# Patient Record
Sex: Female | Born: 1940 | Race: White | Hispanic: No | Smoking: Never smoker
Health system: Southern US, Community
[De-identification: ages and names within clinical notes are randomized; demographics above are authoritative.]

## PROBLEM LIST (undated history)

## (undated) DIAGNOSIS — K219 Gastro-esophageal reflux disease without esophagitis: Secondary | ICD-10-CM

## (undated) DIAGNOSIS — D332 Benign neoplasm of brain, unspecified: Secondary | ICD-10-CM

## (undated) DIAGNOSIS — IMO0002 Reserved for concepts with insufficient information to code with codable children: Secondary | ICD-10-CM

## (undated) DIAGNOSIS — M5416 Radiculopathy, lumbar region: Secondary | ICD-10-CM

## (undated) DIAGNOSIS — I10 Essential (primary) hypertension: Secondary | ICD-10-CM

## (undated) DIAGNOSIS — Z86010 Personal history of colonic polyps: Principal | ICD-10-CM

## (undated) DIAGNOSIS — M8580 Other specified disorders of bone density and structure, unspecified site: Secondary | ICD-10-CM

## (undated) DIAGNOSIS — F419 Anxiety disorder, unspecified: Secondary | ICD-10-CM

## (undated) DIAGNOSIS — K5790 Diverticulosis of intestine, part unspecified, without perforation or abscess without bleeding: Secondary | ICD-10-CM

## (undated) DIAGNOSIS — M21611 Bunion of right foot: Secondary | ICD-10-CM

## (undated) DIAGNOSIS — I679 Cerebrovascular disease, unspecified: Secondary | ICD-10-CM

## (undated) DIAGNOSIS — M21612 Bunion of left foot: Secondary | ICD-10-CM

## (undated) DIAGNOSIS — E785 Hyperlipidemia, unspecified: Secondary | ICD-10-CM

## (undated) DIAGNOSIS — H8110 Benign paroxysmal vertigo, unspecified ear: Secondary | ICD-10-CM

## (undated) DIAGNOSIS — T7840XA Allergy, unspecified, initial encounter: Secondary | ICD-10-CM

## (undated) DIAGNOSIS — G5600 Carpal tunnel syndrome, unspecified upper limb: Secondary | ICD-10-CM

## (undated) DIAGNOSIS — T18128A Food in esophagus causing other injury, initial encounter: Secondary | ICD-10-CM

## (undated) DIAGNOSIS — J45909 Unspecified asthma, uncomplicated: Secondary | ICD-10-CM

## (undated) DIAGNOSIS — E039 Hypothyroidism, unspecified: Secondary | ICD-10-CM

## (undated) DIAGNOSIS — K449 Diaphragmatic hernia without obstruction or gangrene: Secondary | ICD-10-CM

## (undated) DIAGNOSIS — W57XXXA Bitten or stung by nonvenomous insect and other nonvenomous arthropods, initial encounter: Secondary | ICD-10-CM

## (undated) DIAGNOSIS — K317 Polyp of stomach and duodenum: Secondary | ICD-10-CM

## (undated) DIAGNOSIS — W44F3XA Food entering into or through a natural orifice, initial encounter: Secondary | ICD-10-CM

## (undated) DIAGNOSIS — D179 Benign lipomatous neoplasm, unspecified: Secondary | ICD-10-CM

## (undated) DIAGNOSIS — L719 Rosacea, unspecified: Secondary | ICD-10-CM

## (undated) DIAGNOSIS — G43909 Migraine, unspecified, not intractable, without status migrainosus: Secondary | ICD-10-CM

## (undated) HISTORY — DX: Carpal tunnel syndrome, unspecified upper limb: G56.00

## (undated) HISTORY — PX: DILATION AND CURETTAGE OF UTERUS: SHX78

## (undated) HISTORY — DX: Food in esophagus causing other injury, initial encounter: T18.128A

## (undated) HISTORY — DX: Radiculopathy, lumbar region: M54.16

## (undated) HISTORY — DX: Essential (primary) hypertension: I10

## (undated) HISTORY — DX: Food entering into or through a natural orifice, initial encounter: W44.F3XA

## (undated) HISTORY — DX: Reserved for concepts with insufficient information to code with codable children: IMO0002

## (undated) HISTORY — DX: Personal history of colonic polyps: Z86.010

## (undated) HISTORY — DX: Polyp of stomach and duodenum: K31.7

## (undated) HISTORY — DX: Benign paroxysmal vertigo, unspecified ear: H81.10

## (undated) HISTORY — DX: Gastro-esophageal reflux disease without esophagitis: K21.9

## (undated) HISTORY — DX: Cerebrovascular disease, unspecified: I67.9

## (undated) HISTORY — DX: Benign lipomatous neoplasm, unspecified: D17.9

## (undated) HISTORY — DX: Bitten or stung by nonvenomous insect and other nonvenomous arthropods, initial encounter: W57.XXXA

## (undated) HISTORY — DX: Bunion of right foot: M21.611

## (undated) HISTORY — DX: Rosacea, unspecified: L71.9

## (undated) HISTORY — DX: Allergy, unspecified, initial encounter: T78.40XA

## (undated) HISTORY — DX: Migraine, unspecified, not intractable, without status migrainosus: G43.909

## (undated) HISTORY — DX: Anxiety disorder, unspecified: F41.9

## (undated) HISTORY — DX: Diaphragmatic hernia without obstruction or gangrene: K44.9

## (undated) HISTORY — PX: PARTIAL HYSTERECTOMY: SHX80

## (undated) HISTORY — DX: Other specified disorders of bone density and structure, unspecified site: M85.80

## (undated) HISTORY — DX: Diverticulosis of intestine, part unspecified, without perforation or abscess without bleeding: K57.90

## (undated) HISTORY — DX: Unspecified asthma, uncomplicated: J45.909

## (undated) HISTORY — DX: Hyperlipidemia, unspecified: E78.5

## (undated) HISTORY — PX: COLONOSCOPY: SHX174

## (undated) HISTORY — DX: Hypothyroidism, unspecified: E03.9

## (undated) HISTORY — PX: UPPER GASTROINTESTINAL ENDOSCOPY: SHX188

## (undated) HISTORY — DX: Bunion of left foot: M21.612

## (undated) HISTORY — PX: MOUTH SURGERY: SHX715

---

## 1998-04-13 ENCOUNTER — Other Ambulatory Visit: Admission: RE | Admit: 1998-04-13 | Discharge: 1998-04-13 | Payer: Self-pay | Admitting: Gynecology

## 1998-08-24 ENCOUNTER — Ambulatory Visit (HOSPITAL_COMMUNITY): Admission: RE | Admit: 1998-08-24 | Discharge: 1998-08-24 | Payer: Self-pay | Admitting: Obstetrics & Gynecology

## 1999-05-23 ENCOUNTER — Other Ambulatory Visit: Admission: RE | Admit: 1999-05-23 | Discharge: 1999-05-23 | Payer: Self-pay | Admitting: Obstetrics and Gynecology

## 2000-05-12 ENCOUNTER — Other Ambulatory Visit: Admission: RE | Admit: 2000-05-12 | Discharge: 2000-05-12 | Payer: Self-pay | Admitting: Obstetrics and Gynecology

## 2001-05-20 ENCOUNTER — Other Ambulatory Visit: Admission: RE | Admit: 2001-05-20 | Discharge: 2001-05-20 | Payer: Self-pay | Admitting: Obstetrics and Gynecology

## 2002-12-16 ENCOUNTER — Other Ambulatory Visit: Admission: RE | Admit: 2002-12-16 | Discharge: 2002-12-16 | Payer: Self-pay | Admitting: Gynecology

## 2004-01-17 ENCOUNTER — Other Ambulatory Visit: Admission: RE | Admit: 2004-01-17 | Discharge: 2004-01-17 | Payer: Self-pay | Admitting: Gynecology

## 2005-01-02 ENCOUNTER — Other Ambulatory Visit: Admission: RE | Admit: 2005-01-02 | Discharge: 2005-01-02 | Payer: Self-pay | Admitting: Gynecology

## 2005-10-01 ENCOUNTER — Ambulatory Visit: Payer: Self-pay | Admitting: Internal Medicine

## 2005-10-15 ENCOUNTER — Ambulatory Visit: Payer: Self-pay | Admitting: Internal Medicine

## 2006-01-13 ENCOUNTER — Other Ambulatory Visit: Admission: RE | Admit: 2006-01-13 | Discharge: 2006-01-13 | Payer: Self-pay | Admitting: Gynecology

## 2007-01-19 ENCOUNTER — Other Ambulatory Visit: Admission: RE | Admit: 2007-01-19 | Discharge: 2007-01-19 | Payer: Self-pay | Admitting: Gynecology

## 2008-08-05 ENCOUNTER — Encounter: Payer: Self-pay | Admitting: Internal Medicine

## 2008-08-16 ENCOUNTER — Encounter: Admission: RE | Admit: 2008-08-16 | Discharge: 2008-08-16 | Payer: Self-pay | Admitting: Family Medicine

## 2009-01-16 ENCOUNTER — Encounter: Payer: Self-pay | Admitting: Internal Medicine

## 2009-02-17 DIAGNOSIS — K219 Gastro-esophageal reflux disease without esophagitis: Secondary | ICD-10-CM | POA: Insufficient documentation

## 2009-02-17 DIAGNOSIS — IMO0002 Reserved for concepts with insufficient information to code with codable children: Secondary | ICD-10-CM | POA: Insufficient documentation

## 2009-02-17 DIAGNOSIS — F411 Generalized anxiety disorder: Secondary | ICD-10-CM | POA: Insufficient documentation

## 2009-02-17 DIAGNOSIS — E039 Hypothyroidism, unspecified: Secondary | ICD-10-CM | POA: Insufficient documentation

## 2009-02-17 DIAGNOSIS — E785 Hyperlipidemia, unspecified: Secondary | ICD-10-CM | POA: Insufficient documentation

## 2009-02-17 DIAGNOSIS — R1310 Dysphagia, unspecified: Secondary | ICD-10-CM | POA: Insufficient documentation

## 2009-02-17 DIAGNOSIS — K5730 Diverticulosis of large intestine without perforation or abscess without bleeding: Secondary | ICD-10-CM | POA: Insufficient documentation

## 2009-02-23 ENCOUNTER — Ambulatory Visit: Payer: Self-pay | Admitting: Internal Medicine

## 2009-02-24 ENCOUNTER — Ambulatory Visit: Payer: Self-pay | Admitting: Internal Medicine

## 2009-02-24 ENCOUNTER — Encounter: Payer: Self-pay | Admitting: Internal Medicine

## 2009-02-28 ENCOUNTER — Encounter: Payer: Self-pay | Admitting: Internal Medicine

## 2010-04-23 ENCOUNTER — Encounter: Admission: RE | Admit: 2010-04-23 | Discharge: 2010-04-23 | Payer: Self-pay | Admitting: Family Medicine

## 2011-02-03 ENCOUNTER — Ambulatory Visit (HOSPITAL_COMMUNITY)
Admission: EM | Admit: 2011-02-03 | Discharge: 2011-02-03 | Disposition: A | Discharge status: Home / Self Care | Payer: Medicare Other | Source: Ambulatory Visit | Attending: Emergency Medicine | Admitting: Emergency Medicine

## 2011-02-03 ENCOUNTER — Emergency Department (HOSPITAL_BASED_OUTPATIENT_CLINIC_OR_DEPARTMENT_OTHER)
Admission: EM | Admit: 2011-02-03 | Discharge: 2011-02-03 | Disposition: A | Discharge status: Other Acute Inpatient Hospital | Payer: Medicare Other | Source: Home / Self Care | Attending: Emergency Medicine | Admitting: Emergency Medicine

## 2011-02-03 DIAGNOSIS — R1310 Dysphagia, unspecified: Secondary | ICD-10-CM | POA: Insufficient documentation

## 2011-02-03 DIAGNOSIS — T18108A Unspecified foreign body in esophagus causing other injury, initial encounter: Secondary | ICD-10-CM | POA: Insufficient documentation

## 2011-02-03 DIAGNOSIS — Z79899 Other long term (current) drug therapy: Secondary | ICD-10-CM | POA: Insufficient documentation

## 2011-02-03 DIAGNOSIS — D131 Benign neoplasm of stomach: Secondary | ICD-10-CM | POA: Insufficient documentation

## 2011-02-03 DIAGNOSIS — K449 Diaphragmatic hernia without obstruction or gangrene: Secondary | ICD-10-CM | POA: Insufficient documentation

## 2011-02-03 DIAGNOSIS — IMO0002 Reserved for concepts with insufficient information to code with codable children: Secondary | ICD-10-CM | POA: Insufficient documentation

## 2011-02-03 DIAGNOSIS — I10 Essential (primary) hypertension: Secondary | ICD-10-CM | POA: Insufficient documentation

## 2011-02-03 DIAGNOSIS — K219 Gastro-esophageal reflux disease without esophagitis: Secondary | ICD-10-CM | POA: Insufficient documentation

## 2011-02-03 DIAGNOSIS — E7800 Pure hypercholesterolemia, unspecified: Secondary | ICD-10-CM | POA: Insufficient documentation

## 2011-02-03 LAB — CBC
HCT: 45.7 % (ref 36.0–46.0)
Hemoglobin: 15.8 g/dL — ABNORMAL HIGH (ref 12.0–15.0)
MCHC: 34.6 g/dL (ref 30.0–36.0)
WBC: 6.6 10*3/uL (ref 4.0–10.5)

## 2011-02-03 LAB — DIFFERENTIAL
Eosinophils Relative: 1 % (ref 0–5)
Lymphs Abs: 2 10*3/uL (ref 0.7–4.0)

## 2011-02-03 LAB — BASIC METABOLIC PANEL
BUN: 20 mg/dL (ref 6–23)
CO2: 26 mEq/L (ref 19–32)
Calcium: 10.2 mg/dL (ref 8.4–10.5)
Chloride: 104 mEq/L (ref 96–112)
Creatinine, Ser: 0.8 mg/dL (ref 0.4–1.2)

## 2011-02-05 ENCOUNTER — Telehealth: Payer: Self-pay | Admitting: Internal Medicine

## 2011-02-05 NOTE — Telephone Encounter (Signed)
Patient had and EGD to remove a piece of chicken from her throat over the weekend(Dr. Loreta Ave). She is calling because the discharge instructions said to set up a follow up visit with Dr. Juanda Chance. Scheduled patient on 02/14/11 at 3:00 PM with Dr. Juanda Chance

## 2011-02-06 NOTE — Telephone Encounter (Signed)
reviewed and agree. 

## 2011-02-14 ENCOUNTER — Encounter: Payer: Self-pay | Admitting: Internal Medicine

## 2011-02-14 ENCOUNTER — Ambulatory Visit (INDEPENDENT_AMBULATORY_CARE_PROVIDER_SITE_OTHER): Payer: Medicare Other | Admitting: Internal Medicine

## 2011-02-14 VITALS — BP 128/72 | HR 60 | Ht 64.0 in | Wt 181.0 lb

## 2011-02-14 DIAGNOSIS — K219 Gastro-esophageal reflux disease without esophagitis: Secondary | ICD-10-CM

## 2011-02-14 DIAGNOSIS — R933 Abnormal findings on diagnostic imaging of other parts of digestive tract: Secondary | ICD-10-CM

## 2011-02-14 DIAGNOSIS — R1310 Dysphagia, unspecified: Secondary | ICD-10-CM

## 2011-02-14 MED ORDER — HYOSCYAMINE SULFATE 0.125 MG SL SUBL: SUBLINGUAL_TABLET | SUBLINGUAL | Status: DC

## 2011-02-14 MED ORDER — HYOSCYAMINE SULFATE 0.125 MG PO TABS: 0.1250 mg | ORAL_TABLET | ORAL | Status: DC | PRN

## 2011-02-14 NOTE — Patient Instructions (Addendum)
You have been scheduled for a Barium Esophagram with Tablet at [facility] on Tuesday  02/19/11 @10 :30 am. Please make sure not to have anything to eat or drink 4 hours prior to your test. We have sent a prescription to your pharmacy for Levsin 0.125 mg 1 tablet by mouth every 4 hours as needed for difficulty swallowing. CC: Dr Duaine Dredge

## 2011-02-14 NOTE — Progress Notes (Signed)
Kylie Taylor [birth year] MRN [id number]    History of Present Illness:  This is a 70 year old white female with a recent episode of acute dysphagia and food impaction. An upper endoscopy on Feb 02, 2011 by Dr. Loreta Ave completed in the hospital for the food impaction showed a few food particles and an otherwise normal-appearing esophagus. There was a spasm distally but no evidence of stricture. There was a large hiatal hernia. We have seen her in the past and she was dilated in June 2010 with a large dilator with only questionable response. She had a 4 cm hiatal hernia. She has been on a proton pump inhibitor, omeprazole 20 mg daily. The episodes of dysphagia occur intermittently; especially when she tries to eat too fast or if she gets excited, especially when eating in a restaurant. Her father had Barrett's esophagus and was treated at [facility].   Past Medical History  Diagnosis Date  . Anxiety   . DDD (degenerative disc disease)   . Hypothyroidism   . GERD (gastroesophageal reflux disease)   . Diverticulosis   . Hiatal hernia   . Multiple gastric polyps   . Hyperlipemia   . Hypertension   . Rosacea   . Dysphagia    Past Surgical History  Procedure Date  . Partial hysterectomy   . Dilation and curettage of uterus   . Mouth surgery     reports that she has never smoked. She does not have any smokeless tobacco history on file. She reports that she drinks alcohol. She reports that she does not use illicit drugs. family history includes Barrett's esophagus in her father; Breast cancer in an unspecified family member; and Prostate cancer in some unspecified family members.  There is no history of Colon cancer. Allergies  Allergen Reactions  . Meperidine Hcl   . Metronidazole         Review of Systems: Denies odynophagia. Has frequent cough. Denies hoarseness.  Denies abdominal pain. Colonoscopy in January 2007 was normal with mild diverticulosis of the left colon. Recall  colonoscopy January 2017.  The remainder of the 10  point ROS is negative except as outlined in H&P   Physical Exam: General appearance  Well developed and in no distress, normal voice. There was no hoarseness.   Assessment and Plan:  Acute dysphagia to solids. 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 She has a documented 4 cm hiatal hernia. We will obtain a barium esophagram to assess esophageal motility. It will be done with a 13 mm barium tablet to see if there is any delay in passing into the stomach. I have spent some time discussing her hiatal hernia and the need for antireflux measures to the patient. Depending on the barium esophagram results, we will consider an upper endoscopy with dilatation. We will send Levsin sublingually 0.125 mg when necessary for esophageal spasm.   02/14/2011 Kylie Taylor

## 2011-02-19 ENCOUNTER — Ambulatory Visit (HOSPITAL_COMMUNITY)
Admission: RE | Admit: 2011-02-19 | Discharge: 2011-02-19 | Disposition: A | Discharge status: Home / Self Care | Payer: Medicare Other | Source: Ambulatory Visit | Attending: Internal Medicine | Admitting: Internal Medicine

## 2011-02-19 DIAGNOSIS — K224 Dyskinesia of esophagus: Secondary | ICD-10-CM | POA: Insufficient documentation

## 2011-02-19 DIAGNOSIS — K449 Diaphragmatic hernia without obstruction or gangrene: Secondary | ICD-10-CM | POA: Insufficient documentation

## 2011-02-19 DIAGNOSIS — R1310 Dysphagia, unspecified: Secondary | ICD-10-CM | POA: Insufficient documentation

## 2011-02-20 ENCOUNTER — Telehealth: Payer: Self-pay | Admitting: *Deleted

## 2011-02-20 ENCOUNTER — Encounter: Payer: Self-pay | Admitting: *Deleted

## 2011-02-20 DIAGNOSIS — K222 Esophageal obstruction: Secondary | ICD-10-CM

## 2011-02-20 NOTE — Telephone Encounter (Signed)
Left message for patient to call me back. 

## 2011-02-20 NOTE — Telephone Encounter (Signed)
Patient given results as per Dr. Juanda Chance. Scheduled patient for EGE with Dil on 03/04/11 2:30 PM arrival and 3:30 PM procedure. Previsit scheduled on 02/26/11 at 10:30 AM. Letter mailed to patient for previsit.

## 2011-02-20 NOTE — Telephone Encounter (Signed)
Message copied by Daphine Deutscher on Wed Feb 20, 2011  2:58 PM ------      Message from: Hart Carwin      Created: Tue Feb 19, 2011  7:25 PM       Please call pt with results of Ba swallow": abnormal peristalsis, some non propulsive contractions, also stricture  Distal esophagus. She will benefit from EGD/dilation. Please schedule unless she wants to come back and discuss.

## 2011-02-20 NOTE — Telephone Encounter (Signed)
Message copied by Daphine Deutscher on Wed Feb 20, 2011  2:05 PM ------      Message from: Hart Carwin      Created: Tue Feb 19, 2011  7:25 PM       Please call pt with results of Ba swallow": abnormal peristalsis, some non propulsive contractions, also stricture  Distal esophagus. She will benefit from EGD/dilation. Please schedule unless she wants to come back and discuss.

## 2011-02-26 ENCOUNTER — Ambulatory Visit (AMBULATORY_SURGERY_CENTER): Payer: Medicare Other | Admitting: *Deleted

## 2011-02-26 VITALS — Ht 64.5 in | Wt 182.0 lb

## 2011-02-26 DIAGNOSIS — R1310 Dysphagia, unspecified: Secondary | ICD-10-CM

## 2011-03-04 ENCOUNTER — Ambulatory Visit (AMBULATORY_SURGERY_CENTER): Payer: Medicare Other | Admitting: Internal Medicine

## 2011-03-04 ENCOUNTER — Encounter: Payer: Self-pay | Admitting: Internal Medicine

## 2011-03-04 DIAGNOSIS — R1310 Dysphagia, unspecified: Secondary | ICD-10-CM

## 2011-03-04 DIAGNOSIS — K219 Gastro-esophageal reflux disease without esophagitis: Secondary | ICD-10-CM

## 2011-03-04 DIAGNOSIS — K222 Esophageal obstruction: Secondary | ICD-10-CM

## 2011-03-04 DIAGNOSIS — R933 Abnormal findings on diagnostic imaging of other parts of digestive tract: Secondary | ICD-10-CM

## 2011-03-04 MED ORDER — SODIUM CHLORIDE 0.9 % IV SOLN: 500.0000 mL | INTRAVENOUS | Status: DC

## 2011-03-04 NOTE — Patient Instructions (Signed)
Discharge instructions given with verbal understanding. Handout given on stricture, hiatal hernia and a dilatation diet. Resume previous medications.

## 2011-03-05 ENCOUNTER — Telehealth: Payer: Self-pay

## 2011-03-05 NOTE — Telephone Encounter (Signed)

## 2011-03-08 ENCOUNTER — Encounter: Payer: Self-pay | Admitting: Internal Medicine

## 2011-10-14 ENCOUNTER — Other Ambulatory Visit: Payer: Self-pay | Admitting: Family Medicine

## 2011-10-14 DIAGNOSIS — R42 Dizziness and giddiness: Secondary | ICD-10-CM

## 2011-10-19 ENCOUNTER — Ambulatory Visit
Admission: RE | Admit: 2011-10-19 | Discharge: 2011-10-19 | Disposition: A | Discharge status: Home / Self Care | Payer: Medicare Other | Source: Ambulatory Visit | Attending: Family Medicine | Admitting: Family Medicine

## 2011-10-19 DIAGNOSIS — R42 Dizziness and giddiness: Secondary | ICD-10-CM

## 2013-08-26 ENCOUNTER — Other Ambulatory Visit: Payer: Self-pay | Admitting: Family Medicine

## 2013-08-26 ENCOUNTER — Ambulatory Visit
Admission: RE | Admit: 2013-08-26 | Discharge: 2013-08-26 | Disposition: A | Discharge status: Home / Self Care | Payer: Medicare Other | Source: Ambulatory Visit | Attending: Family Medicine | Admitting: Family Medicine

## 2013-08-26 DIAGNOSIS — R059 Cough, unspecified: Secondary | ICD-10-CM

## 2013-08-26 DIAGNOSIS — R05 Cough: Secondary | ICD-10-CM

## 2014-05-11 ENCOUNTER — Other Ambulatory Visit (HOSPITAL_BASED_OUTPATIENT_CLINIC_OR_DEPARTMENT_OTHER): Payer: Self-pay | Admitting: Family Medicine

## 2014-05-11 DIAGNOSIS — J45901 Unspecified asthma with (acute) exacerbation: Secondary | ICD-10-CM

## 2014-05-16 ENCOUNTER — Ambulatory Visit (HOSPITAL_BASED_OUTPATIENT_CLINIC_OR_DEPARTMENT_OTHER)
Admission: RE | Admit: 2014-05-16 | Discharge: 2014-05-16 | Disposition: A | Discharge status: Home / Self Care | Payer: Medicare Other | Source: Ambulatory Visit | Attending: Family Medicine | Admitting: Family Medicine

## 2014-05-16 DIAGNOSIS — R0602 Shortness of breath: Secondary | ICD-10-CM | POA: Diagnosis not present

## 2014-05-16 DIAGNOSIS — R05 Cough: Secondary | ICD-10-CM | POA: Insufficient documentation

## 2014-05-16 DIAGNOSIS — R059 Cough, unspecified: Secondary | ICD-10-CM | POA: Insufficient documentation

## 2014-05-16 DIAGNOSIS — R0989 Other specified symptoms and signs involving the circulatory and respiratory systems: Secondary | ICD-10-CM | POA: Diagnosis not present

## 2014-05-16 DIAGNOSIS — J45909 Unspecified asthma, uncomplicated: Secondary | ICD-10-CM | POA: Insufficient documentation

## 2014-05-16 DIAGNOSIS — J45901 Unspecified asthma with (acute) exacerbation: Secondary | ICD-10-CM

## 2015-01-19 ENCOUNTER — Other Ambulatory Visit: Payer: Self-pay | Admitting: Family Medicine

## 2015-01-19 DIAGNOSIS — R519 Headache, unspecified: Secondary | ICD-10-CM

## 2015-01-19 DIAGNOSIS — R270 Ataxia, unspecified: Secondary | ICD-10-CM

## 2015-01-19 DIAGNOSIS — R42 Dizziness and giddiness: Secondary | ICD-10-CM

## 2015-01-19 DIAGNOSIS — R51 Headache: Secondary | ICD-10-CM

## 2015-01-19 DIAGNOSIS — Z86011 Personal history of benign neoplasm of the brain: Secondary | ICD-10-CM

## 2015-02-01 ENCOUNTER — Ambulatory Visit
Admission: RE | Admit: 2015-02-01 | Discharge: 2015-02-01 | Disposition: A | Discharge status: Home / Self Care | Payer: Medicare Other | Source: Ambulatory Visit | Attending: Family Medicine | Admitting: Family Medicine

## 2015-02-01 DIAGNOSIS — R51 Headache: Secondary | ICD-10-CM

## 2015-02-01 DIAGNOSIS — R519 Headache, unspecified: Secondary | ICD-10-CM

## 2015-02-01 DIAGNOSIS — R42 Dizziness and giddiness: Secondary | ICD-10-CM

## 2015-02-01 DIAGNOSIS — Z86011 Personal history of benign neoplasm of the brain: Secondary | ICD-10-CM

## 2015-02-01 DIAGNOSIS — R270 Ataxia, unspecified: Secondary | ICD-10-CM

## 2015-02-01 MED ORDER — GADOBENATE DIMEGLUMINE 529 MG/ML IV SOLN
16.0000 mL | Freq: Once | INTRAVENOUS | Status: AC | PRN
  Administered 2015-02-01: 16 mL via INTRAVENOUS

## 2015-09-11 ENCOUNTER — Emergency Department (HOSPITAL_BASED_OUTPATIENT_CLINIC_OR_DEPARTMENT_OTHER)
Admission: EM | Admit: 2015-09-11 | Discharge: 2015-09-11 | Disposition: A | Discharge status: Home / Self Care | Payer: Medicare Other | Attending: Emergency Medicine | Admitting: Emergency Medicine

## 2015-09-11 ENCOUNTER — Emergency Department (HOSPITAL_BASED_OUTPATIENT_CLINIC_OR_DEPARTMENT_OTHER): Payer: Medicare Other

## 2015-09-11 ENCOUNTER — Encounter (HOSPITAL_BASED_OUTPATIENT_CLINIC_OR_DEPARTMENT_OTHER): Payer: Self-pay | Admitting: *Deleted

## 2015-09-11 DIAGNOSIS — W228XXA Striking against or struck by other objects, initial encounter: Secondary | ICD-10-CM | POA: Insufficient documentation

## 2015-09-11 DIAGNOSIS — Y9389 Activity, other specified: Secondary | ICD-10-CM | POA: Diagnosis not present

## 2015-09-11 DIAGNOSIS — F419 Anxiety disorder, unspecified: Secondary | ICD-10-CM | POA: Insufficient documentation

## 2015-09-11 DIAGNOSIS — E039 Hypothyroidism, unspecified: Secondary | ICD-10-CM | POA: Diagnosis not present

## 2015-09-11 DIAGNOSIS — Z872 Personal history of diseases of the skin and subcutaneous tissue: Secondary | ICD-10-CM | POA: Diagnosis not present

## 2015-09-11 DIAGNOSIS — Z1881 Retained glass fragments: Secondary | ICD-10-CM

## 2015-09-11 DIAGNOSIS — S61219A Laceration without foreign body of unspecified finger without damage to nail, initial encounter: Secondary | ICD-10-CM

## 2015-09-11 DIAGNOSIS — K219 Gastro-esophageal reflux disease without esophagitis: Secondary | ICD-10-CM | POA: Diagnosis not present

## 2015-09-11 DIAGNOSIS — Z86011 Personal history of benign neoplasm of the brain: Secondary | ICD-10-CM | POA: Insufficient documentation

## 2015-09-11 DIAGNOSIS — Z79899 Other long term (current) drug therapy: Secondary | ICD-10-CM | POA: Insufficient documentation

## 2015-09-11 DIAGNOSIS — Y998 Other external cause status: Secondary | ICD-10-CM | POA: Insufficient documentation

## 2015-09-11 DIAGNOSIS — I10 Essential (primary) hypertension: Secondary | ICD-10-CM | POA: Diagnosis not present

## 2015-09-11 DIAGNOSIS — Z793 Long term (current) use of hormonal contraceptives: Secondary | ICD-10-CM | POA: Insufficient documentation

## 2015-09-11 DIAGNOSIS — Z8669 Personal history of other diseases of the nervous system and sense organs: Secondary | ICD-10-CM | POA: Insufficient documentation

## 2015-09-11 DIAGNOSIS — E785 Hyperlipidemia, unspecified: Secondary | ICD-10-CM | POA: Insufficient documentation

## 2015-09-11 DIAGNOSIS — S61223A Laceration with foreign body of left middle finger without damage to nail, initial encounter: Secondary | ICD-10-CM | POA: Insufficient documentation

## 2015-09-11 DIAGNOSIS — Z8739 Personal history of other diseases of the musculoskeletal system and connective tissue: Secondary | ICD-10-CM | POA: Diagnosis not present

## 2015-09-11 DIAGNOSIS — Y9289 Other specified places as the place of occurrence of the external cause: Secondary | ICD-10-CM | POA: Insufficient documentation

## 2015-09-11 DIAGNOSIS — S6992XA Unspecified injury of left wrist, hand and finger(s), initial encounter: Secondary | ICD-10-CM | POA: Diagnosis present

## 2015-09-11 DIAGNOSIS — Z7982 Long term (current) use of aspirin: Secondary | ICD-10-CM | POA: Insufficient documentation

## 2015-09-11 HISTORY — DX: Benign neoplasm of brain, unspecified: D33.2

## 2015-09-11 MED ORDER — LIDOCAINE HCL (PF) 1 % IJ SOLN
5.0000 mL | Freq: Once | INTRAMUSCULAR | Status: AC
  Administered 2015-09-11: 5 mL
  Filled 2015-09-11: qty 5

## 2015-09-11 NOTE — Discharge Instructions (Signed)
Nonsutured Laceration Care °A laceration is a cut that goes through all layers of the skin and extends into the tissue that is right under the skin. This type of cut is usually stitched up (sutured) or closed with tape (adhesive strips) or skin glue shortly after the injury happens. °However, if the wound is dirty or if several hours pass before medical treatment is provided, it is likely that germs (bacteria) will enter the wound. Closing a laceration after bacteria have entered it increases the risk of infection. In these cases, your health care provider may leave the laceration open (nonsutured) and cover it with a bandage. This type of treatment helps prevent infection and allows the wound to heal from the deepest layer of tissue damage up to the surface. °An open fracture is a type of injury that may involve nonsutured lacerations. An open fracture is a break in a bone that happens along with one or more lacerations through the skin that is near the fracture site. °HOW TO CARE FOR YOUR NONSUTURED LACERATION °· Take or apply over-the-counter and prescription medicines only as told by your health care provider. °· If you were prescribed an antibiotic medicine, take or apply it as told by your health care provider. Do not stop using the antibiotic even if your condition improves. °· Clean the wound one time each day or as told by your health care provider. °¨ Wash the wound with mild soap and water. °¨ Rinse the wound with water to remove all soap. °¨ Pat your wound dry with a clean towel. Do not rub the wound. °· Do not inject anything into the wound unless your health care provider told you to. °· Change any bandages (dressings) as told by your health care provider. This includes changing the dressing if it gets wet, dirty, or starts to smell bad. °· Keep the dressing dry until your health care provider says it can be removed. Do not take baths, swim, or do anything that puts your wound underwater until your  health care provider approves. °· Raise (elevate) the injured area above the level of your heart while you are sitting or lying down, if possible. °· Do not scratch or pick at the wound. °· Check your wound every day for signs of infection. Watch for: °¨ Redness, swelling, or pain. °¨ Fluid, blood, or pus. °· Keep all follow-up visits as told by your health care provider. This is important. °SEEK MEDICAL CARE IF: °· You received a tetanus and shot and you have swelling, severe pain, redness, or bleeding at the injection site.   °· You have a fever. °· Your pain is not controlled with medicine. °· You have increased redness, swelling, or pain at the site of your wound. °· You have fluid, blood, or pus coming from your wound. °· You notice a bad smell coming from your wound or your dressing. °· You notice something coming out of the wound, such as wood or glass. °· You notice a change in the color of your skin near your wound. °· You develop a new rash. °· You need to change the dressing frequently due to fluid, blood, or pus draining from the wound. °· You develop numbness around your wound. °SEEK IMMEDIATE MEDICAL CARE IF: °· Your pain suddenly increases and is severe. °· You develop severe swelling around the wound. °· The wound is on your hand or foot and you cannot properly move a finger or toe. °· The wound is on your hand or   foot and you notice that your fingers or toes look pale or bluish. °· You have a red streak going away from your wound. °  °This information is not intended to replace advice given to you by your health care provider. Make sure you discuss any questions you have with your health care provider. °  °Document Released: 08/07/2006 Document Revised: 01/24/2015 Document Reviewed: 09/05/2014 °Elsevier Interactive Patient Education ©2016 [employer] ° °

## 2015-09-11 NOTE — ED Notes (Signed)
She tripped last night while carrying a glass jar. Laceration to her left middle finger. Bleeding is controlled.

## 2015-09-11 NOTE — ED Provider Notes (Signed)
CSN: [id number]     Arrival date & time 09/11/15  1637 History   First MD Initiated Contact with Patient 09/11/15 1846     Chief Complaint  Patient presents with  . Hand Injury     (Consider location/radiation/quality/duration/timing/severity/associated sxs/prior Treatment) HPI  Kylie Taylor is a(n) 74 y.o. female who presents to the emergency department for evaluation of laceration. The patient was carrying a glass jar, she tripped and smashed the door and caused a laceration to her left middle and ring finger. She cleaned the area last night and wrapped it. She comes for valuation of the wound today, as she feels the laceration of her middle finger is particularly painful. She is up-to-date on her tetanus vaccination, denies signs or symptoms of infection. She denies any numbness or tingling. She is full range of motion of both fingers Past Medical History  Diagnosis Date  . Anxiety   . Hypothyroidism   . GERD (gastroesophageal reflux disease)   . Diverticulosis   . Hiatal hernia   . Multiple gastric polyps   . Hyperlipemia   . Hypertension   . Rosacea   . Dysphagia   . Allergy     seasonal  . Cataract   . DDD (degenerative disc disease)   . Osteopenia   . Benign brain tumor [facility])    Past Surgical History  Procedure Laterality Date  . Partial hysterectomy    . Dilation and curettage of uterus    . Mouth surgery    . Colonoscopy    . Upper gastrointestinal endoscopy     Family History  Problem Relation Age of Onset  . Barrett's esophagus Father   . Colon cancer Neg Hx   . Breast cancer      aunt  . Prostate cancer      grandfather/uncle  . Brain cancer      MGM, PAT AUNT,PAT UNCLE   Social History  Substance Use Topics  . Smoking status: Never Smoker   . Smokeless tobacco: Never Used  . Alcohol Use: Yes     Comment: occasional   OB History    No data available     Review of Systems  Ten systems reviewed and are negative for acute change, except as  noted in the HPI.  ll  Allergies  Meperidine hcl and Metronidazole  Home Medications   Prior to Admission medications   Medication Sig Start Date End Date Taking? Authorizing Provider  SIMVASTATIN PO Take by mouth.   Yes Historical Provider, MD  ALPRAZolam Duanne Moron) 0.5 MG tablet Take 0.5 mg by mouth as needed.      Historical Provider, MD  aspirin 81 MG tablet Take 81 mg by mouth daily.      Historical Provider, MD  Biotin (BIOTIN 5000) 5 MG CAPS Take by mouth daily.      Historical Provider, MD  Calcium Citrate-Vitamin D (CITRACAL + D PO) Take by mouth. 2000 mg by  Mouth once daily     Historical Provider, MD  estradiol (ESTRACE VAGINAL) 0.1 MG/GM vaginal cream Place vaginally daily. As directed     Historical Provider, MD  estradiol (VIVELLE-DOT) 0.0375 MG/24HR Place 1 patch onto the skin once a week.      Historical Provider, MD  HYDROcodone-acetaminophen (VICODIN) 5-500 MG per tablet Take 1 tablet by mouth as needed.      Historical Provider, MD  hyoscyamine (LEVSIN/SL) 0.125 MG SL tablet Dissolve 1 tablet under the tongue every 4 hours as needed for  difficulty swallowing. 02/14/11   Lafayette Dragon, MD  levothyroxine (LEVOXYL) 100 MCG tablet Take 100 mcg by mouth daily.      Historical Provider, MD  LOSARTAN POTASSIUM PO Take by mouth. Takes 160 mg by mouth once daily     Historical Provider, MD  lovastatin (MEVACOR) 40 MG tablet Take 40 mg by mouth at bedtime.      Historical Provider, MD  meloxicam (MOBIC) 7.5 MG tablet Take 7.5 mg by mouth as needed.      Historical Provider, MD  metroNIDAZOLE (METROGEL) 0.75 % gel Apply 1 application topically as needed.      Historical Provider, MD  Omega-3 Fatty Acids (FISH OIL) 1200 MG CAPS Take by mouth 2 (two) times daily.      Historical Provider, MD  omeprazole (PRILOSEC) 20 MG capsule Take 20 mg by mouth every other day.     Historical Provider, MD  triamterene-hydrochlorothiazide (MAXZIDE-25) 37.5-25 MG per tablet Take 1 tablet by mouth daily.       Historical Provider, MD  VITAMIN D, CHOLECALCIFEROL, PO Take 1 capsule by mouth daily.      Historical Provider, MD   BP 132/80 mmHg  Pulse 83  Temp(Src) 98 F (36.7 C) (Oral)  Resp 20  Ht 5' 4.5" (1.638 m)  Wt 77.111 kg  BMI 28.74 kg/m2  SpO2 100% Physical Exam  Constitutional: She is oriented to person, place, and time. She appears well-developed and well-nourished. No distress.  HENT:  Head: Normocephalic and atraumatic.  Eyes: Conjunctivae are normal. No scleral icterus.  Neck: Normal range of motion.  Cardiovascular: Normal rate, regular rhythm and normal heart sounds.  Exam reveals no gallop and no friction rub.   No murmur heard. Pulmonary/Chest: Effort normal and breath sounds normal. No respiratory distress.  Abdominal: Soft. Bowel sounds are normal. She exhibits no distension and no mass. There is no tenderness. There is no guarding.  Neurological: She is alert and oriented to person, place, and time.  Skin: Skin is warm and dry. She is not diaphoretic.  1 cm laceration of the dorsal surface of the left middle finger. 1 cm laceration of the palmar surface at the base of the MCP joint. Full range of motion of the fingers  Nursing note and vitals reviewed.   ED Course  .Foreign Body Removal Date/Time: 09/14/2015 5:02 PM Performed by: Margarita Mail Authorized by: Margarita Mail Consent: Verbal consent obtained. Risks and benefits: risks, benefits and alternatives were discussed Consent given by: patient Patient understanding: patient states understanding of the procedure being performed Patient identity confirmed: verbally with patient Time out: Immediately prior to procedure a "time out" was called to verify the correct patient, procedure, equipment, support staff and site/side marked as required. Body area: skin General location: upper extremity Location details: left long finger Anesthesia: local infiltration Local anesthetic: lidocaine 1% without  epinephrine Anesthetic total: 2 ml Patient restrained: no Patient cooperative: yes Localization method: visualized Removal mechanism: forceps Dressing: dressing applied Tendon involvement: none Depth: subcutaneous Complexity: simple 1 objects recovered. Objects recovered: small piece of glass Post-procedure assessment: foreign body removed Patient tolerance: Patient tolerated the procedure well with no immediate complications   (including critical care time) Labs Review Labs Reviewed - No data to display  Imaging Review Dg Hand Complete Left  09/11/2015  CLINICAL DATA:  74 year old who tripped at home while carrying a glass jar. The jar broke and she sustained lacerations to the 3rd and 4th digits. Initial encounter. EXAM: LEFT HAND -  COMPLETE 3+ VIEW COMPARISON:  None. FINDINGS: No evidence of acute fracture or dislocation. Soft tissue laceration involving the proximal aspect of the long and ring fingers. Small faintly opaque foreign body in the medial subcutaneous tissues of the proximal long finger. No opaque foreign bodies elsewhere. Generalized osseous demineralization. Narrowing of the IP joint spaces of the fingers, worst at the DIP joint of the small finger. Severe joint space narrowing involving the trapezium-1st metacarpal joint. IMPRESSION: 1. No acute osseous abnormality. 2. Small faintly opaque foreign body in the medial soft tissues at the base of the long finger consistent with a small glass shard. 3. 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 4. Osseous demineralization. Electronically Signed   By: Evangeline Dakin M.D.   On: 09/11/2015 17:05   I have personally reviewed and evaluated these images and lab results as part of my medical decision-making.   EKG Interpretation None      MDM   Final diagnoses:  Laceration of finger of left hand, initial encounter  Retained glass fragment     Laceration. Retained glass. Foreign body removed from skin. Up-to-date on tetanus vaccination. Patient is out of the window for suture repair. The wounds were cleansed and dressed. Follow up information on wound healing and wound care given. Return precautions discussed. She appears safe for discharge at this time.  PATIENT INFORMED OF POSSIBILITY OF RETAINED FOREIGN MATERIAL EVEN AFTER CLEANSING AND DBRIDEMENT.      Margarita Mail, PA-C 09/14/15 [place], MD 09/15/15 [phone]

## 2015-10-16 ENCOUNTER — Telehealth: Payer: Self-pay | Admitting: Internal Medicine

## 2015-10-17 NOTE — Telephone Encounter (Signed)
OK 

## 2015-10-25 ENCOUNTER — Encounter: Payer: Self-pay | Admitting: Internal Medicine

## 2015-12-11 ENCOUNTER — Ambulatory Visit (AMBULATORY_SURGERY_CENTER): Payer: Self-pay | Admitting: *Deleted

## 2015-12-11 VITALS — Ht 64.0 in | Wt 179.8 lb

## 2015-12-11 DIAGNOSIS — Z1211 Encounter for screening for malignant neoplasm of colon: Secondary | ICD-10-CM

## 2015-12-11 NOTE — Progress Notes (Signed)
No egg or soy allergy known to patient  No issues with past sedation with any surgeries  or procedures, no intubation problems  No diet pills per patient No home 02 use per patient  No blood thinners per patient  Pt states occasional issues with constipation --has to eat a lot of beans and greens and uses occasional stool softener- 2-3 times a year that she has issues

## 2015-12-28 ENCOUNTER — Encounter: Payer: Self-pay | Admitting: Internal Medicine

## 2015-12-28 ENCOUNTER — Ambulatory Visit (AMBULATORY_SURGERY_CENTER): Payer: Medicare Other | Admitting: Internal Medicine

## 2015-12-28 VITALS — BP 93/57 | HR 59 | Temp 96.5°F | Resp 14 | Ht 64.0 in | Wt 179.0 lb

## 2015-12-28 DIAGNOSIS — Z1211 Encounter for screening for malignant neoplasm of colon: Secondary | ICD-10-CM

## 2015-12-28 DIAGNOSIS — D175 Benign lipomatous neoplasm of intra-abdominal organs: Secondary | ICD-10-CM

## 2015-12-28 DIAGNOSIS — D120 Benign neoplasm of cecum: Secondary | ICD-10-CM | POA: Diagnosis not present

## 2015-12-28 MED ORDER — SODIUM CHLORIDE 0.9 % IV SOLN: 500.0000 mL | INTRAVENOUS | Status: DC

## 2015-12-28 NOTE — Progress Notes (Signed)
Called to room to assist during endoscopic procedure.  Patient ID and intended procedure confirmed with present staff. Received instructions for my participation in the procedure from the performing physician.  

## 2015-12-28 NOTE — Op Note (Signed)
[place] Patient Name: Kylie Taylor Procedure Date: 12/28/2015 9:40 AM MRN: [id number] Endoscopist: Gatha Mayer , MD Age: 75 Date of Birth: February 24, 1941 Gender: Female Procedure:                Colonoscopy Indications:              Screening for colorectal malignant neoplasm Medicines:                Propofol per Anesthesia, Monitored Anesthesia Care Procedure:                Pre-Anesthesia Assessment:                           - Prior to the procedure, a History and Physical                            was performed, and patient medications and                            allergies were reviewed. The patient's tolerance of                            previous anesthesia was also reviewed. The risks                            and benefits of the procedure and the sedation                            options and risks were discussed with the patient.                            All questions were answered, and informed consent                            was obtained. Prior Anticoagulants: The patient has                            taken no previous anticoagulant or antiplatelet                            agents. ASA Grade Assessment: II - A patient with                            mild systemic disease. After reviewing the risks                            and benefits, the patient was deemed in                            satisfactory condition to undergo the procedure.                           After obtaining informed consent, the colonoscope  was passed under direct vision. Throughout the                            procedure, the patient's blood pressure, pulse, and                            oxygen saturations were monitored continuously. The                            Model CF-HQ190L [id number]) scope was introduced                            through the anus and advanced to the the cecum,                            identified by appendiceal  orifice and ileocecal                            valve. The colonoscopy was performed without                            difficulty. The patient tolerated the procedure                            well. The quality of the bowel preparation was                            good. The bowel preparation used was Miralax. The                            ileocecal valve, appendiceal orifice, and rectum                            were photographed. Scope In: 9:42:19 AM Scope Out: 9:53:50 AM Scope Withdrawal Time: 0 hours 9 minutes 4 seconds  Total Procedure Duration: 0 hours 11 minutes 31 seconds  Findings:                 The perianal and digital rectal examinations were                            normal.                           A 2 mm polyp was found in the cecum. The polyp was                            sessile. The polyp was removed with a cold biopsy                            forceps. Resection and retrieval were complete.                            Verification of patient identification for the  specimen was done. Estimated blood loss: none.                           Diverticula were found in the sigmoid colon.                           The exam was otherwise without abnormality on                            direct and retroflexion views. Complications:            No immediate complications. Estimated Blood Loss:     Estimated blood loss: none. Impression:               - One 2 mm polyp in the cecum, removed with a cold                            biopsy forceps. Resected and retrieved.                           - Moderate diverticulosis in the sigmoid colon.                           - The examination was otherwise normal on direct                            and retroflexion views. Recommendation:           - Patient has a contact number available for                            emergencies. The signs and symptoms of potential                            delayed  complications were discussed with the                            patient. Return to normal activities tomorrow.                            Written discharge instructions were provided to the                            patient.                           - Resume previous diet.                           - Continue present medications.                           - No repeat colonoscopy due to age. Gatha Mayer, MD 12/28/2015 10:00:28 AM This report has been signed electronically. CC Letter to:             Derinda Late

## 2015-12-28 NOTE — Patient Instructions (Addendum)
There was one tiny polyp I removed. It is not cancer. You also have a condition called diverticulosis - common and not usually a problem. Please read the handout provided.  You will not need any more routine colonoscopy.  Nice to see you again today.  I appreciate the opportunity to care for you. Gatha Mayer, MD, [facility]   Discharge instructions given. Handouts on polyps and diverticulosis. Resume previous medications. YOU HAD AN ENDOSCOPIC PROCEDURE TODAY AT [facility]:   Refer to the procedure report that was given to you for any specific questions about what was found during the examination.  If the procedure report does not answer your questions, please call your gastroenterologist to clarify.  If you requested that your care partner not be given the details of your procedure findings, then the procedure report has been included in a sealed envelope for you to review at your convenience later.  YOU SHOULD EXPECT: Some feelings of bloating in the abdomen. Passage of more gas than usual.  Walking can help get rid of the air that was put into your GI tract during the procedure and reduce the bloating. If you had a lower endoscopy (such as a colonoscopy or flexible sigmoidoscopy) you may notice spotting of blood in your stool or on the toilet paper. If you underwent a bowel prep for your procedure, you may not have a normal bowel movement for a few days.  Please Note:  You might notice some irritation and congestion in your nose or some drainage.  This is from the oxygen used during your procedure.  There is no need for concern and it should clear up in a day or so.  SYMPTOMS TO REPORT IMMEDIATELY:   Following lower endoscopy (colonoscopy or flexible sigmoidoscopy):  Excessive amounts of blood in the stool  Significant tenderness or worsening of abdominal pains  Swelling of the abdomen that is new, acute  Fever of 100F or higher  For urgent or emergent  issues, a gastroenterologist can be reached at any hour by calling [phone].   DIET: Your first meal following the procedure should be a small meal and then it is ok to progress to your normal diet. Heavy or fried foods are harder to digest and may make you feel nauseous or bloated.  Likewise, meals heavy in dairy and vegetables can increase bloating.  Drink plenty of fluids but you should avoid alcoholic beverages for 24 hours.  ACTIVITY:  You should plan to take it easy for the rest of today and you should NOT DRIVE or use heavy machinery until tomorrow (because of the sedation medicines used during the test).    FOLLOW UP: Our staff will call the number listed on your records the next business day following your procedure to check on you and address any questions or concerns that you may have regarding the information given to you following your procedure. If we do not reach you, we will leave a message.  However, if you are feeling well and you are not experiencing any problems, there is no need to return our call.  We will assume that you have returned to your regular daily activities without incident.  If any biopsies were taken you will be contacted by phone or by letter within the next 1-3 weeks.  Please call us at [phone] if you have not heard about the biopsies in 3 weeks.    SIGNATURES/CONFIDENTIALITY: You and/or your care partner have signed  paperwork which will be entered into your electronic medical record.  These signatures attest to the fact that that the information above on your After Visit Summary has been reviewed and is understood.  Full responsibility of the confidentiality of this discharge information lies with you and/or your care-partner.

## 2015-12-28 NOTE — Progress Notes (Signed)
Report to PACU, RN, vss, BBS= Clear.  

## 2015-12-29 ENCOUNTER — Telehealth: Payer: Self-pay | Admitting: *Deleted

## 2015-12-29 NOTE — Telephone Encounter (Signed)
  Follow up Call-  Call back number 12/28/2015  Post procedure Call Back phone  # [phone]  Permission to leave phone message Yes     Patient questions:  Message left to call us if necessary.

## 2016-01-03 ENCOUNTER — Encounter: Payer: Self-pay | Admitting: Internal Medicine

## 2016-01-03 DIAGNOSIS — Z86010 Personal history of colonic polyps: Secondary | ICD-10-CM

## 2016-01-03 DIAGNOSIS — Z860101 Personal history of adenomatous and serrated colon polyps: Secondary | ICD-10-CM

## 2016-01-03 HISTORY — DX: Personal history of adenomatous and serrated colon polyps: Z86.0101

## 2016-01-03 HISTORY — DX: Personal history of colonic polyps: Z86.010

## 2016-01-03 NOTE — Progress Notes (Signed)
Quick Note:  2 mm adenoma No recall (age) ______

## 2016-02-08 ENCOUNTER — Encounter: Payer: Self-pay | Admitting: Internal Medicine

## 2016-07-12 ENCOUNTER — Ambulatory Visit (INDEPENDENT_AMBULATORY_CARE_PROVIDER_SITE_OTHER): Payer: Medicare Other | Admitting: Family

## 2016-07-12 DIAGNOSIS — M1711 Unilateral primary osteoarthritis, right knee: Secondary | ICD-10-CM | POA: Diagnosis not present

## 2016-07-29 ENCOUNTER — Telehealth (INDEPENDENT_AMBULATORY_CARE_PROVIDER_SITE_OTHER): Payer: Self-pay | Admitting: *Deleted

## 2016-07-29 NOTE — Telephone Encounter (Signed)
I called and sw pt and she just wanted to know what otc med Dr. Sharol Given recommends. I advised 2 aleve bid per dr. Sharol Given and she has already cleared this med with her PCP per pt he said it was ok for her to take and she will try this and call if she has any other questions.

## 2016-07-29 NOTE — Telephone Encounter (Signed)
Pt. Called stating she had some concerns about her medications. She has blood pressure medicines so she is careful what over the counter meds she takes for inflammation.

## 2016-08-27 ENCOUNTER — Ambulatory Visit (INDEPENDENT_AMBULATORY_CARE_PROVIDER_SITE_OTHER): Payer: Medicare Other | Admitting: Orthopedic Surgery

## 2016-08-27 VITALS — Ht 64.0 in | Wt 179.0 lb

## 2016-08-27 DIAGNOSIS — M170 Bilateral primary osteoarthritis of knee: Secondary | ICD-10-CM | POA: Diagnosis not present

## 2016-08-27 NOTE — Progress Notes (Signed)
Office Visit Note   Patient: Kylie Taylor           Date of Birth: [birth year]           MRN: [id number] Visit Date: 08/27/2016              Requested by: Derinda Late, MD [address] [place], [place] [address] PCP: Marylene Land, MD   Assessment & Plan: Visit Diagnoses:  1. Bilateral primary osteoarthritis of knee     Plan: Recommended continue swimming for exercises 3 times a week discussed that if her knee pain worsens we could consider repeat steroid injection for her right knee she states her left knee is doing much better she is using a cane. Discussed that we get temporary relief with the steroid injection we could try a hyaluronic acid injection.  Follow-Up Instructions: Return if symptoms worsen or fail to improve.   Orders:  No orders of the defined types were placed in this encounter.  No orders of the defined types were placed in this encounter.     Procedures: No procedures performed   Clinical Data: No additional findings.   Subjective: Chief Complaint  Patient presents with  . Right Knee - Pain    Right knee pain. Pt states that about 2 months ago that she had a steroid injection and that it did help for several weeks. Past few weeks the pain has come back more intensely. Medical side pain rad down medial side calf. She does ambulate with a cane at times. She states that at one point there was a hypersensitivity to touch even " the bed sheets"  caused pain.  Decreased ROM and activity due to the pain.     Review of Systems   Objective: Vital Signs: Ht 5\' 4"  (1.626 m)   Wt 179 lb (81.2 kg)   BMI 30.73 kg/m   Physical Exam examination patient is alert oriented no adenopathy well-dressed Y affect normal respiratory effort she has a normal gait. Examination of both legs she has varicose veins but no redness no cellulitis no ulcers. She has crepitation with range of motion of the right knee class appreciates are stable she's nicely tender  to palpation of the medial joint line. Radiographs were not obtained. There are no mechanical catching or locking symptoms she does have episodes of giving way.  Ortho Exam  Specialty Comments:  No specialty comments available.  Imaging: No results found.   PMFS History: Patient Active Problem List   Diagnosis Date Noted  . Bilateral primary osteoarthritis of knee 08/27/2016  . Hx of adenomatous polyp of colon 01/03/2016  . HYPOTHYROIDISM 02/17/2009  . HYPERLIPIDEMIA 02/17/2009  . ANXIETY 02/17/2009  . GERD 02/17/2009  . DIVERTICULOSIS OF COLON 02/17/2009  . DEGENERATIVE DISC DISEASE 02/17/2009  . DYSPHAGIA UNSPECIFIED 02/17/2009   Past Medical History:  Diagnosis Date  . Allergy    seasonal  . Anxiety   . Asthma   . 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 Cataract   . DDD (degenerative disc disease)   . Diverticulosis    2007 colon  . Dysphagia   . Food impaction of esophagus    multiple  . GERD (gastroesophageal reflux disease)   . Hiatal hernia   . Hx of adenomatous polyp of colon 01/03/2016  . Hyperlipemia   . Hypertension   . Hypothyroidism   . Multiple gastric polyps   . Osteopenia   . Rosacea   . Tick bites  2017- taking Doxycycline for this    Family History  Problem Relation Age of Onset  . Colon cancer Neg Hx   . Colon polyps Neg Hx   . Rectal cancer Neg Hx   . Stomach cancer Neg Hx   . Esophageal cancer Neg Hx   . Breast cancer      aunt  . Prostate cancer      grandfather/uncle  . Brain cancer      MGM, PAT AUNT,PAT UNCLE  . Barrett's esophagus Father     Past Surgical History:  Procedure Laterality Date  . COLONOSCOPY    . DILATION AND CURETTAGE OF UTERUS    . MOUTH SURGERY    . PARTIAL HYSTERECTOMY    . UPPER GASTROINTESTINAL ENDOSCOPY     Social History   Occupational History  . retired    Social History Main Topics  . Smoking status: Never Smoker  . Smokeless tobacco: Never Used  . Alcohol use 0.0 oz/week     Comment:  occasional  . Drug use: No  . Sexual activity: Not on file

## 2018-03-27 ENCOUNTER — Encounter (HOSPITAL_BASED_OUTPATIENT_CLINIC_OR_DEPARTMENT_OTHER): Payer: Self-pay | Admitting: Emergency Medicine

## 2018-03-27 ENCOUNTER — Observation Stay (HOSPITAL_BASED_OUTPATIENT_CLINIC_OR_DEPARTMENT_OTHER)
Admission: EM | Admit: 2018-03-27 | Discharge: 2018-03-27 | Disposition: A | Discharge status: Home / Self Care | Payer: Medicare Other | Attending: Internal Medicine | Admitting: Internal Medicine

## 2018-03-27 ENCOUNTER — Emergency Department (HOSPITAL_BASED_OUTPATIENT_CLINIC_OR_DEPARTMENT_OTHER): Payer: Medicare Other

## 2018-03-27 ENCOUNTER — Other Ambulatory Visit: Payer: Self-pay

## 2018-03-27 ENCOUNTER — Observation Stay (HOSPITAL_BASED_OUTPATIENT_CLINIC_OR_DEPARTMENT_OTHER): Payer: Medicare Other

## 2018-03-27 DIAGNOSIS — Z90711 Acquired absence of uterus with remaining cervical stump: Secondary | ICD-10-CM | POA: Diagnosis not present

## 2018-03-27 DIAGNOSIS — E785 Hyperlipidemia, unspecified: Secondary | ICD-10-CM | POA: Diagnosis not present

## 2018-03-27 DIAGNOSIS — E032 Hypothyroidism due to medicaments and other exogenous substances: Secondary | ICD-10-CM | POA: Diagnosis not present

## 2018-03-27 DIAGNOSIS — Z91018 Allergy to other foods: Secondary | ICD-10-CM | POA: Diagnosis not present

## 2018-03-27 DIAGNOSIS — E669 Obesity, unspecified: Secondary | ICD-10-CM | POA: Diagnosis not present

## 2018-03-27 DIAGNOSIS — Z86010 Personal history of colonic polyps: Secondary | ICD-10-CM | POA: Insufficient documentation

## 2018-03-27 DIAGNOSIS — K219 Gastro-esophageal reflux disease without esophagitis: Secondary | ICD-10-CM | POA: Diagnosis not present

## 2018-03-27 DIAGNOSIS — M8580 Other specified disorders of bone density and structure, unspecified site: Secondary | ICD-10-CM | POA: Diagnosis not present

## 2018-03-27 DIAGNOSIS — Z9889 Other specified postprocedural states: Secondary | ICD-10-CM | POA: Insufficient documentation

## 2018-03-27 DIAGNOSIS — I110 Hypertensive heart disease with heart failure: Secondary | ICD-10-CM | POA: Insufficient documentation

## 2018-03-27 DIAGNOSIS — J45909 Unspecified asthma, uncomplicated: Secondary | ICD-10-CM | POA: Insufficient documentation

## 2018-03-27 DIAGNOSIS — I700 Atherosclerosis of aorta: Secondary | ICD-10-CM | POA: Diagnosis not present

## 2018-03-27 DIAGNOSIS — Z7982 Long term (current) use of aspirin: Secondary | ICD-10-CM | POA: Diagnosis not present

## 2018-03-27 DIAGNOSIS — I5030 Unspecified diastolic (congestive) heart failure: Secondary | ICD-10-CM

## 2018-03-27 DIAGNOSIS — K224 Dyskinesia of esophagus: Secondary | ICD-10-CM | POA: Diagnosis not present

## 2018-03-27 DIAGNOSIS — Z79899 Other long term (current) drug therapy: Secondary | ICD-10-CM | POA: Insufficient documentation

## 2018-03-27 DIAGNOSIS — R0789 Other chest pain: Principal | ICD-10-CM | POA: Insufficient documentation

## 2018-03-27 DIAGNOSIS — R079 Chest pain, unspecified: Secondary | ICD-10-CM | POA: Diagnosis present

## 2018-03-27 DIAGNOSIS — Z6830 Body mass index (BMI) 30.0-30.9, adult: Secondary | ICD-10-CM | POA: Diagnosis not present

## 2018-03-27 DIAGNOSIS — Z881 Allergy status to other antibiotic agents status: Secondary | ICD-10-CM | POA: Insufficient documentation

## 2018-03-27 DIAGNOSIS — L719 Rosacea, unspecified: Secondary | ICD-10-CM | POA: Insufficient documentation

## 2018-03-27 DIAGNOSIS — Z885 Allergy status to narcotic agent status: Secondary | ICD-10-CM | POA: Insufficient documentation

## 2018-03-27 LAB — CBC WITH DIFFERENTIAL/PLATELET
Basophils Absolute: 0 10*3/uL (ref 0.0–0.1)
Basophils Relative: 1 %
Eosinophils Absolute: 0.3 10*3/uL (ref 0.0–0.7)
Eosinophils Relative: 5 %
HCT: 42.7 % (ref 36.0–46.0)
Hemoglobin: 14.7 g/dL (ref 12.0–15.0)
LYMPHS ABS: 2.3 10*3/uL (ref 0.7–4.0)
Lymphocytes Relative: 42 %
MCH: 31.1 pg (ref 26.0–34.0)
MCHC: 34.4 g/dL (ref 30.0–36.0)
MCV: 90.3 fL (ref 78.0–100.0)
Monocytes Absolute: 0.6 10*3/uL (ref 0.1–1.0)
Monocytes Relative: 12 %
Neutro Abs: 2.1 10*3/uL (ref 1.7–7.7)
Neutrophils Relative %: 40 %
Platelets: 151 10*3/uL (ref 150–400)
RBC: 4.73 MIL/uL (ref 3.87–5.11)
RDW: 12.6 % (ref 11.5–15.5)
WBC: 5.4 10*3/uL (ref 4.0–10.5)

## 2018-03-27 LAB — COMPREHENSIVE METABOLIC PANEL
ALK PHOS: 75 U/L (ref 38–126)
ALT: 23 U/L (ref 0–44)
ANION GAP: 8 (ref 5–15)
AST: 25 U/L (ref 15–41)
Albumin: 3.7 g/dL (ref 3.5–5.0)
BILIRUBIN TOTAL: 0.4 mg/dL (ref 0.3–1.2)
BUN: 23 mg/dL (ref 8–23)
CALCIUM: 8.4 mg/dL — AB (ref 8.9–10.3)
CO2: 22 mmol/L (ref 22–32)
CREATININE: 1.09 mg/dL — AB (ref 0.44–1.00)
Chloride: 109 mmol/L (ref 98–111)
GFR calc Af Amer: 55 mL/min — ABNORMAL LOW (ref >60)
GFR calc non Af Amer: 48 mL/min — ABNORMAL LOW (ref >60)
GLUCOSE: 101 mg/dL — AB (ref 70–99)
Potassium: 4 mmol/L (ref 3.5–5.1)
SODIUM: 139 mmol/L (ref 135–145)
Total Protein: 6.2 g/dL — ABNORMAL LOW (ref 6.5–8.1)

## 2018-03-27 LAB — ECHOCARDIOGRAM COMPLETE
Height: 64 in
WEIGHTICAEL: 2886.4 [oz_av]

## 2018-03-27 LAB — TROPONIN I

## 2018-03-27 LAB — LIPASE, BLOOD: Lipase: 52 U/L — ABNORMAL HIGH (ref 11–51)

## 2018-03-27 MED ORDER — LOSARTAN POTASSIUM 50 MG PO TABS
50.0000 mg | ORAL_TABLET | Freq: Every day | ORAL | Status: DC
  Administered 2018-03-27: 50 mg via ORAL
  Filled 2018-03-27: qty 1

## 2018-03-27 MED ORDER — ASPIRIN 81 MG PO CHEW
243.0000 mg | CHEWABLE_TABLET | Freq: Once | ORAL | Status: AC
  Administered 2018-03-27: 243 mg via ORAL
  Filled 2018-03-27: qty 3

## 2018-03-27 MED ORDER — ALBUTEROL SULFATE (2.5 MG/3ML) 0.083% IN NEBU: 3.0000 mL | INHALATION_SOLUTION | Freq: Four times a day (QID) | RESPIRATORY_TRACT | Status: DC | PRN

## 2018-03-27 MED ORDER — ENOXAPARIN SODIUM 40 MG/0.4ML ~~LOC~~ SOLN
40.0000 mg | SUBCUTANEOUS | Status: DC
  Administered 2018-03-27: 40 mg via SUBCUTANEOUS
  Filled 2018-03-27: qty 0.4

## 2018-03-27 MED ORDER — NITROGLYCERIN 0.4 MG SL SUBL
0.4000 mg | SUBLINGUAL_TABLET | SUBLINGUAL | Status: DC | PRN
  Administered 2018-03-27 (×3): 0.4 mg via SUBLINGUAL
  Filled 2018-03-27: qty 1

## 2018-03-27 MED ORDER — ASPIRIN EC 81 MG PO TBEC: 81.0000 mg | DELAYED_RELEASE_TABLET | Freq: Every day | ORAL | Status: DC

## 2018-03-27 MED ORDER — LEVOTHYROXINE SODIUM 100 MCG PO TABS: 100.0000 ug | ORAL_TABLET | Freq: Every day | ORAL | Status: DC

## 2018-03-27 MED ORDER — TRIAMTERENE-HCTZ 37.5-25 MG PO TABS
1.0000 | ORAL_TABLET | Freq: Every day | ORAL | Status: DC
  Administered 2018-03-27: 1 via ORAL
  Filled 2018-03-27: qty 1

## 2018-03-27 NOTE — Plan of Care (Signed)

## 2018-03-27 NOTE — ED Triage Notes (Signed)
Pt states she woke up today having 3/10 right side rib cage pain radiating to jaw and neck, pt denies any SOB, nausea or vomiting, denies any injury, pt very emotional on triage.

## 2018-03-27 NOTE — ED Notes (Signed)
Patient remains chest pain free, sitting up in bed with no complaints at present.

## 2018-03-27 NOTE — Progress Notes (Signed)
  Echocardiogram 2D Echocardiogram has been performed.  Jennette Dubin 03/27/2018, 11:27 AM

## 2018-03-27 NOTE — ED Provider Notes (Signed)
[facility] Provider Note   CSN: [id number] Arrival date & time: 03/27/18  0539     History   Chief Complaint Chief Complaint  Patient presents with  . Rib cage pain    HPI Kylie Taylor is a 77 y.o. female.  HPI   77 yo M with h/o HTN, HLD, gastritis/esophageal spasm here w/ chest pain. Pt states her pain began gradually as an aching left-sided chest pain along lateral aspect of her chest. It then "moved" toward her right side. She has since had associated aching, throbbing R jaw pressure and now notes some aching in her left arm. She states she has not had any associated nausea, vomiting, or diaphoresis. H/o esophageal issues and does not some worsening reflux lately, but states she has never had pain like this with her reflux. Nothing out of the usual to eat last night. Denies any cough, leg swelling, h/o PE or DVT. Pain does not change or worsen with inspiration or movement, or palpation. No alleviating factors. Took one ASA 81 mg PTA.  Past Medical History:  Diagnosis Date  . Allergy    seasonal  . Anxiety   . Asthma   . 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 Cataract   . DDD (degenerative disc disease)   . Diverticulosis    2007 colon  . Dysphagia   . Food impaction of esophagus    multiple  . GERD (gastroesophageal reflux disease)   . Hiatal hernia   . Hx of adenomatous polyp of colon 01/03/2016  . Hyperlipemia   . Hypertension   . Hypothyroidism   . Multiple gastric polyps   . Osteopenia   . Rosacea   . Tick bites    2017- taking Doxycycline for this    Patient Active Problem List   Diagnosis Date Noted  . Bilateral primary osteoarthritis of knee 08/27/2016  . Hx of adenomatous polyp of colon 01/03/2016  . HYPOTHYROIDISM 02/17/2009  . HYPERLIPIDEMIA 02/17/2009  . ANXIETY 02/17/2009  . GERD 02/17/2009  . DIVERTICULOSIS OF COLON 02/17/2009  . DEGENERATIVE DISC DISEASE 02/17/2009  . DYSPHAGIA UNSPECIFIED 02/17/2009    Past  Surgical History:  Procedure Laterality Date  . COLONOSCOPY    . DILATION AND CURETTAGE OF UTERUS    . MOUTH SURGERY    . PARTIAL HYSTERECTOMY    . UPPER GASTROINTESTINAL ENDOSCOPY       OB History   None      Home Medications    Prior to Admission medications   Medication Sig Start Date End Date Taking? Authorizing Provider  albuterol (PROVENTIL HFA;VENTOLIN HFA) 108 (90 Base) MCG/ACT inhaler Inhale 2 puffs into the lungs every 6 (six) hours as needed for wheezing or shortness of breath. Reported on 12/11/2015    [provider]  aspirin 81 MG tablet Take 81 mg by mouth daily.      [provider]  Calcium Citrate-Vitamin D (CITRACAL + D PO) Take by mouth. 2000 mg by  Mouth once daily     [provider]  DOXYCYCLINE CALCIUM PO Take by mouth. 10 days for tick bites    [provider]  estradiol (ESTRACE VAGINAL) 0.1 MG/GM vaginal cream Place vaginally daily. Reported on 12/11/2015    [provider]  hyoscyamine (LEVSIN/SL) 0.125 MG SL tablet Dissolve 1 tablet under the tongue every 4 hours as needed for difficulty swallowing. 02/14/11   Lafayette Dragon, MD  levothyroxine (LEVOXYL) 100 MCG tablet  Take 100 mcg by mouth daily.      [provider]  LOSARTAN POTASSIUM PO Take by mouth. Takes 160 mg by mouth once daily     [provider]  meloxicam (MOBIC) 7.5 MG tablet Take 7.5 mg by mouth as needed. Reported on 12/11/2015    [provider]  omeprazole (PRILOSEC) 20 MG capsule Take 20 mg by mouth every other day.     [provider]  SIMVASTATIN PO Take 40 mg by mouth daily.     [provider]  triamterene-hydrochlorothiazide (MAXZIDE-25) 37.5-25 MG per tablet Take 1 tablet by mouth daily.      [provider]  VITAMIN D, CHOLECALCIFEROL, PO Take 2,000 Units by mouth daily.     [provider]    Family History Family History  Problem Relation Age of Onset  . Breast cancer  Unknown        aunt  . Prostate cancer Unknown        grandfather/uncle  . Brain cancer Unknown        MGM, PAT AUNT,PAT UNCLE  . Barrett's esophagus Father   . Colon cancer Neg Hx   . Colon polyps Neg Hx   . Rectal cancer Neg Hx   . Stomach cancer Neg Hx   . Esophageal cancer Neg Hx     Social History Social History   Tobacco Use  . Smoking status: Never Smoker  . Smokeless tobacco: Never Used  Substance Use Topics  . Alcohol use: Yes    Alcohol/week: 0.0 oz    Comment: occasional  . Drug use: No     Allergies   Coffee bean extract [coffea arabica]; Meperidine hcl; Metronidazole; and Tobacco [nicotiana tabacum]   Review of Systems Review of Systems  Constitutional: Negative for chills, fatigue and fever.  HENT: Negative for congestion and rhinorrhea.   Eyes: Negative for visual disturbance.  Respiratory: Positive for chest tightness. Negative for cough, shortness of breath and wheezing.   Cardiovascular: Positive for chest pain. Negative for leg swelling.  Gastrointestinal: Negative for abdominal pain, diarrhea, nausea and vomiting.  Genitourinary: Negative for dysuria and flank pain.  Musculoskeletal: Positive for arthralgias. Negative for neck pain and neck stiffness.  Skin: Negative for rash and wound.  Allergic/Immunologic: Negative for immunocompromised state.  Neurological: Negative for syncope, weakness and headaches.  All other systems reviewed and are negative.    Physical Exam Updated Vital Signs BP (!) 161/81   Pulse (!) 56   Temp 97.7 F (36.5 C) (Oral)   Resp 11   Ht 5\' 4"  (1.626 m)   Wt 81.2 kg (179 lb)   SpO2 100%   BMI 30.73 kg/m   Physical Exam  Constitutional: She is oriented to person, place, and time. She appears well-developed and well-nourished. She appears distressed (anxious).  HENT:  Head: Normocephalic and atraumatic.  Eyes: Conjunctivae are normal.  Neck: Neck supple.  Cardiovascular: Normal rate, regular rhythm and  normal heart sounds. Exam reveals no friction rub.  No murmur heard. Pulmonary/Chest: Effort normal and breath sounds normal. No respiratory distress. She has no wheezes. She has no rales. She exhibits no tenderness.  No chest wall TTP  Abdominal: Soft. She exhibits no distension.  Musculoskeletal: She exhibits no edema.  Neurological: She is alert and oriented to person, place, and time. She exhibits normal muscle tone.  Skin: Skin is warm. Capillary refill takes less than 2 seconds.  Psychiatric: She has a normal mood and affect.  Nursing note and vitals reviewed.    ED Treatments / Results  Labs (all labs ordered are listed, but only abnormal results are displayed) Labs Reviewed  COMPREHENSIVE METABOLIC PANEL - Abnormal; Notable for the following components:      Result Value   Glucose, Bld 101 (*)    Creatinine, Ser 1.09 (*)    Calcium 8.4 (*)    Total Protein 6.2 (*)    GFR calc non Af Amer 48 (*)    GFR calc Af Amer 55 (*)    All other components within normal limits  LIPASE, BLOOD - Abnormal; Notable for the following components:   Lipase 52 (*)    All other components within normal limits  CBC WITH DIFFERENTIAL/PLATELET  TROPONIN I    EKG EKG Interpretation  Date/Time:  Friday March 27 2018 05:49:42 EDT Ventricular Rate:  66 PR Interval:    QRS Duration: 98 QT Interval:  425 QTC Calculation: 446 R Axis:   71 Text Interpretation:  Sinus rhythm No old tracing to compare Confirmed by Duffy Bruce [phone]) on 03/27/2018 5:57:04 AM   Radiology Dg Chest 2 View  Result Date: 03/27/2018 CLINICAL DATA:  77 y/o F; bilateral rib pain, minimal right jaw pain, chronic cough. EXAM: CHEST - 2 VIEW COMPARISON:  05/16/2014 chest radiograph. FINDINGS: Normal cardiac silhouette. Aortic atherosclerosis with calcification. Clear lungs. No pleural effusion or pneumothorax. No acute osseous abnormality is evident. IMPRESSION: No acute pulmonary process identified.  Aortic  atherosclerosis. Electronically Signed   By: Kristine Garbe M.D.   On: 03/27/2018 06:36    Procedures Procedures (including critical care time)  Medications Ordered in ED Medications  nitroGLYCERIN (NITROSTAT) SL tablet 0.4 mg (0.4 mg Sublingual Given 03/27/18 0650)  aspirin chewable tablet 243 mg (243 mg Oral Given 03/27/18 0640)     Initial Impression / Assessment and Plan / ED Course  I have reviewed the triage vital signs and the nursing notes.  Pertinent labs & imaging results that were available during my care of the patient were reviewed by me and considered in my medical decision making (see chart for details).     77 yo F here with chest, jaw, and L arm pain/tightness. History is somewhat atypical in terms of distribution of pain (more lateral, along ribs) but pt also with radiating jaw/arm pain. Pt adamant that pain is different from her esophageal spasm pain. HEART score of 4 (age, HTN, HLD, obesity). Initial labs are reassuring with negative troponin. Pt given remainder of ASA 325 dose as well as nitro, with relief of pain. Given concerning risk factors, HEART score, relief w/ nitro, will admit to Hospitalist for observation.   Final Clinical Impressions(s) / ED Diagnoses   Final diagnoses:  Atypical chest pain    ED Discharge Orders    None       Duffy Bruce, MD 03/27/18 [phone]

## 2018-03-27 NOTE — Discharge Instructions (Signed)
Follow with Derinda Late, MD in 1-2 weeks  Please get a complete blood count and chemistry panel checked by your Primary MD at your next visit, and again as instructed by your Primary MD. Please get your medications reviewed and adjusted by your Primary MD.  Please request your Primary MD to go over all Hospital Tests and Procedure/Radiological results at the follow up, please get all Hospital records sent to your Prim MD by signing hospital release before you go home.  If you had Pneumonia of Lung problems at the Hospital: Please get a 2 view Chest X ray done in 6-8 weeks after hospital discharge or sooner if instructed by your Primary MD.  If you have Congestive Heart Failure: Please call your Cardiologist or Primary MD anytime you have any of the following symptoms:  1) 3 pound weight gain in 24 hours or 5 pounds in 1 week  2) shortness of breath, with or without a dry hacking cough  3) swelling in the hands, feet or stomach  4) if you have to sleep on extra pillows at night in order to breathe  Follow cardiac low salt diet and 1.5 lit/day fluid restriction.  If you have diabetes Accuchecks 4 times/day, Once in AM empty stomach and then before each meal. Log in all results and show them to your primary doctor at your next visit. If any glucose reading is under 80 or above 300 call your primary MD immediately.  If you have Seizure/Convulsions/Epilepsy: Please do not drive, operate heavy machinery, participate in activities at heights or participate in high speed sports until you have seen by Primary MD or a Neurologist and advised to do so again.  If you had Gastrointestinal Bleeding: Please ask your Primary MD to check a complete blood count within one week of discharge or at your next visit. Your endoscopic/colonoscopic biopsies that are pending at the time of discharge, will also need to followed by your Primary MD.  Get Medicines reviewed and adjusted. Please take all your  medications with you for your next visit with your Primary MD  Please request your Primary MD to go over all hospital tests and procedure/radiological results at the follow up, please ask your Primary MD to get all Hospital records sent to his/her office.  If you experience worsening of your admission symptoms, develop shortness of breath, life threatening emergency, suicidal or homicidal thoughts you must seek medical attention immediately by calling 911 or calling your MD immediately  if symptoms less severe.  You must read complete instructions/literature along with all the possible adverse reactions/side effects for all the Medicines you take and that have been prescribed to you. Take any new Medicines after you have completely understood and accpet all the possible adverse reactions/side effects.   Do not drive or operate heavy machinery when taking Pain medications.   Do not take more than prescribed Pain, Sleep and Anxiety Medications  Special Instructions: If you have smoked or chewed Tobacco  in the last 2 yrs please stop smoking, stop any regular Alcohol  and or any Recreational drug use.  Wear Seat belts while driving.  Please note You were cared for by a hospitalist during your hospital stay. If you have any questions about your discharge medications or the care you received while you were in the hospital after you are discharged, you can call the unit and asked to speak with the hospitalist on call if the hospitalist that took care of you is not available. Once  you are discharged, your primary care physician will handle any further medical issues. Please note that NO REFILLS for any discharge medications will be authorized once you are discharged, as it is imperative that you return to your primary care physician (or establish a relationship with a primary care physician if you do not have one) for your aftercare needs so that they can reassess your need for medications and monitor your  lab values.  You can reach the hospitalist office at phone [phone] or fax [phone]   If you do not have a primary care physician, you can call [phone] for a physician referral.  Activity: As tolerated with Full fall precautions use walker/cane & assistance as needed  Diet: regular  Disposition Home

## 2018-03-27 NOTE — H&P (Signed)
History and Physical    Kylie Taylor IRJ:[id number] DOB: 01-Apr-1941 DOA: 03/27/2018  I have briefly reviewed the patient's prior medical records in [place]  PCP: Derinda Late, MD  Patient coming from: home  Chief Complaint: chest pain  HPI: Kylie Taylor is a 77 y.o. female with medical history significant of hypertension, hyperlipidemia, obesity, who presents to the hospital with chief complaint of chest pain.  She reports long-standing history of esophageal problems in the setting of hiatal hernia, and she tells me the last night she may have eaten a larger meal than normal.  She felt chest pressure which is normal for her every time she eats more, and that resolved overnight.  When she woke up this morning, she described feeling left-sided rib pain under her left breast, initially thought that there was related to how she slept.  Then the pain seemed to migrate to the right side in the right rib cage started hurting.  It radiated to right shoulder and right jaw, she got concerned at that time and decided to come to the hospital.  She denies any personal history of cardiac disease however her dad had a heart attack when he was 42.  She exercises regularly, swimming 3 times a week, denies any chest pain or breathing difficulties with that.  She has a degree of dyspnea on exertion which is chronic for her and its in the setting of asthma.  She denies any abdominal pain, nausea or vomiting.  She denies any fever or chills, she has no lightheadedness or dizziness.  She denies any diaphoresis.  ED Course: In the emergency room patient received nitroglycerin and at that time jaw pain subsided.  EKG was nonischemic.  Initial troponin was negative.  Labs showed a creatinine of 1.09, slightly elevated lipase at 52, normal LFTs.  We are asked to admit for chest pain rule out  Review of Systems: As per HPI otherwise 10 point review of systems negative.   Past Medical History:  Diagnosis Date    . Allergy    seasonal  . Anxiety   . Asthma   . 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 Cataract   . DDD (degenerative disc disease)   . Diverticulosis    2007 colon  . Dysphagia   . Food impaction of esophagus    multiple  . GERD (gastroesophageal reflux disease)   . Hiatal hernia   . Hx of adenomatous polyp of colon 01/03/2016  . Hyperlipemia   . Hypertension   . Hypothyroidism   . Multiple gastric polyps   . Osteopenia   . Rosacea   . Tick bites    2017- taking Doxycycline for this    Past Surgical History:  Procedure Laterality Date  . COLONOSCOPY    . DILATION AND CURETTAGE OF UTERUS    . MOUTH SURGERY    . PARTIAL HYSTERECTOMY    . UPPER GASTROINTESTINAL ENDOSCOPY       reports that she has never smoked. She has never used smokeless tobacco. She reports that she drinks alcohol. She reports that she does not use drugs.  Allergies  Allergen Reactions  . Coffee Bean Extract [Coffea Arabica] Other (See Comments)  . Meperidine Hcl Nausea And Vomiting    demerol  . Metronidazole Other (See Comments)    Flagyl- mouth ulcers   . Tobacco [Nicotiana Tabacum]     Smoke- Cannot ride in car with those who smoke d/t sensitivity    Family  History  Problem Relation Age of Onset  . Breast cancer Unknown        aunt  . Prostate cancer Unknown        grandfather/uncle  . Brain cancer Unknown        MGM, PAT AUNT,PAT UNCLE  . Barrett's esophagus Father   . Colon cancer Neg Hx   . Colon polyps Neg Hx   . Rectal cancer Neg Hx   . Stomach cancer Neg Hx   . Esophageal cancer Neg Hx     Prior to Admission medications   Medication Sig Start Date End Date Taking? Authorizing Provider  albuterol (PROVENTIL HFA;VENTOLIN HFA) 108 (90 Base) MCG/ACT inhaler Inhale 2 puffs into the lungs every 6 (six) hours as needed for wheezing or shortness of breath. Reported on 12/11/2015    [provider]  aspirin 81 MG tablet Take 81 mg by mouth daily.      [provider]   Calcium Citrate-Vitamin D (CITRACAL + D PO) Take by mouth. 2000 mg by  Mouth once daily     [provider]  DOXYCYCLINE CALCIUM PO Take by mouth. 10 days for tick bites    [provider]  estradiol (ESTRACE VAGINAL) 0.1 MG/GM vaginal cream Place vaginally daily. Reported on 12/11/2015    [provider]  hyoscyamine (LEVSIN/SL) 0.125 MG SL tablet Dissolve 1 tablet under the tongue every 4 hours as needed for difficulty swallowing. 02/14/11   Lafayette Dragon, MD  levothyroxine (LEVOXYL) 100 MCG tablet Take 100 mcg by mouth daily.      [provider]  LOSARTAN POTASSIUM PO Take by mouth. Takes 160 mg by mouth once daily     [provider]  meloxicam (MOBIC) 7.5 MG tablet Take 7.5 mg by mouth as needed. Reported on 12/11/2015    [provider]  omeprazole (PRILOSEC) 20 MG capsule Take 20 mg by mouth every other day.     [provider]  SIMVASTATIN PO Take 40 mg by mouth daily.     [provider]  triamterene-hydrochlorothiazide (MAXZIDE-25) 37.5-25 MG per tablet Take 1 tablet by mouth daily.      [provider]  VITAMIN D, CHOLECALCIFEROL, PO Take 2,000 Units by mouth daily.     [provider]    Physical Exam: Vitals:   03/27/18 7353 03/27/18 0807 03/27/18 0912 03/27/18 0921  BP: (!) 161/81 (!) 162/69  (!) 131/109  Pulse:  76  61  Resp:  19    Temp:    97.8 F (36.6 C)  TempSrc:    Oral  SpO2:  97%  99%  Weight:   81.8 kg (180 lb 6.4 oz)   Height:   5\' 4"  (1.626 m)       Constitutional: NAD, calm, comfortable Eyes: PERRL, lids and conjunctivae normal ENMT: Mucous membranes are moist.  Neck: normal, supple, no masses, no thyromegaly Respiratory: clear to auscultation bilaterally, no wheezing, no crackles. Normal respiratory effort. No accessory muscle use.  Cardiovascular: Regular rate and rhythm, no murmurs / rubs / gallops. No extremity edema. 2+ pedal pulses.  Abdomen: no tenderness, no  masses palpated. Bowel sounds positive.  Musculoskeletal: no clubbing / cyanosis. Normal muscle tone.  Skin: no rashes, lesions, ulcers. No induration Neurologic: CN 2-12 grossly intact. Strength 5/5 in all 4.  Psychiatric: Normal judgment and insight. Alert and oriented x 3.  Labs on Admission: I have personally reviewed following labs and imaging studies  CBC: Recent  Labs  Lab 03/27/18 0614  WBC 5.4  NEUTROABS 2.1  HGB 14.7  HCT 42.7  MCV 90.3  PLT 527   Basic Metabolic Panel: Recent Labs  Lab 03/27/18 0614  NA 139  K 4.0  CL 109  CO2 22  GLUCOSE 101*  BUN 23  CREATININE 1.09*  CALCIUM 8.4*   GFR: Estimated Creatinine Clearance: 44.7 mL/min (A) (by C-G formula based on SCr of 1.09 mg/dL (H)). Liver Function Tests: Recent Labs  Lab 03/27/18 0614  AST 25  ALT 23  ALKPHOS 75  BILITOT 0.4  PROT 6.2*  ALBUMIN 3.7   Recent Labs  Lab 03/27/18 0614  LIPASE 52*   No results for input(s): AMMONIA in the last 168 hours. Coagulation Profile: No results for input(s): INR, PROTIME in the last 168 hours. Cardiac Enzymes: Recent Labs  Lab 03/27/18 0614  TROPONINI <0.03   BNP (last 3 results) No results for input(s): PROBNP in the last 8760 hours. HbA1C: No results for input(s): HGBA1C in the last 72 hours. CBG: No results for input(s): GLUCAP in the last 168 hours. Lipid Profile: No results for input(s): CHOL, HDL, LDLCALC, TRIG, CHOLHDL, LDLDIRECT in the last 72 hours. Thyroid Function Tests: No results for input(s): TSH, T4TOTAL, FREET4, T3FREE, THYROIDAB in the last 72 hours. Anemia Panel: No results for input(s): VITAMINB12, FOLATE, FERRITIN, TIBC, IRON, RETICCTPCT in the last 72 hours. Urine analysis: No results found for: COLORURINE, APPEARANCEUR, LABSPEC, PHURINE, GLUCOSEU, HGBUR, BILIRUBINUR, KETONESUR, PROTEINUR, UROBILINOGEN, NITRITE, LEUKOCYTESUR   Radiological Exams on Admission: Dg Chest 2 View  Result Date: 03/27/2018 CLINICAL DATA:  77 y/o  F; bilateral rib pain, minimal right jaw pain, chronic cough. EXAM: CHEST - 2 VIEW COMPARISON:  05/16/2014 chest radiograph. FINDINGS: Normal cardiac silhouette. Aortic atherosclerosis with calcification. Clear lungs. No pleural effusion or pneumothorax. No acute osseous abnormality is evident. IMPRESSION: No acute pulmonary process identified.  Aortic atherosclerosis. Electronically Signed   By: Kristine Garbe M.D.   On: 03/27/2018 06:36    EKG: Independently reviewed. Sinus rhythm   Assessment/Plan Active Problems:   Chest pain   Chest Pain -With atypical components, she has no prior cardiac history, case was discussed with cardiology on-call Dr. Sallyanne Kuster who reviewed the case.  We will cycle cardiac enzymes, obtain a 2D echo to rule out structural heart disease, and may benefit from further outpatient evaluation.  -She has a history of esophageal spasms, which also can respond to nitroglycerin  Hypertension -Resume losartan Maxide  Hypothyroidism -Resume Synthroid  History of asthma -No wheezing, albuterol as needed  DVT prophylaxis: Lovenox Code Status: Full code  Family Communication: no family present at bedside Disposition Plan: Admit to telemetry, Home when ready Consults called: cardiology over the phone    Admission status: observation   At the point of initial evaluation, it is my clinical opinion that admission for OBSERVATION is reasonable and necessary because the patient's presenting complaints in the context of their chronic conditions represent sufficient risk of deterioration or significant morbidity to constitute reasonable grounds for close observation in the hospital setting, but that the patient may be medically stable for discharge from the hospital within 24 to 48 hours.   Marzetta Board, MD [facility] Pager [phone]  If 7PM-7AM, please contact night-coverage [email or web address] Password [facility]  03/27/2018, 9:36 AM

## 2018-03-27 NOTE — Progress Notes (Signed)
Discharge instructions given to patient prior to discharged home with neighbor. No acute distress noted. Taken down via W/C by NT.

## 2018-03-29 NOTE — Discharge Summary (Signed)
Physician Discharge Summary  Kylie Taylor PYP:[id number] DOB: 08/01/1941 DOA: 03/27/2018  PCP: Derinda Late, MD  Admit date: 03/27/2018 Discharge date: 03/29/2018  Admitted From: Home Disposition:  home  Recommendations for Outpatient Follow-up:  1. Follow up with PCP in 1-2 weeks  Home Health: none Equipment/Devices: none  Discharge Condition: stable CODE STATUS: Full code Diet recommendation: regular  HPI: Kylie Taylor is a 77 y.o. female with medical history significant of hypertension, hyperlipidemia, obesity, who presents to the hospital with chief complaint of chest pain.  She reports long-standing history of esophageal problems in the setting of hiatal hernia, and she tells me the last night she may have eaten a larger meal than normal.  She felt chest pressure which is normal for her every time she eats more, and that resolved overnight.  When she woke up this morning, she described feeling left-sided rib pain under her left breast, initially thought that there was related to how she slept.  Then the pain seemed to migrate to the right side in the right rib cage started hurting.  It radiated to right shoulder and right jaw, she got concerned at that time and decided to come to the hospital.  She denies any personal history of cardiac disease however her dad had a heart attack when he was 78.  She exercises regularly, swimming 3 times a week, denies any chest pain or breathing difficulties with that.  She has a degree of dyspnea on exertion which is chronic for her and its in the setting of asthma.  She denies any abdominal pain, nausea or vomiting.  She denies any fever or chills, she has no lightheadedness or dizziness.  She denies any diaphoresis.  Hospital Course: Chest Pain -With atypical components, she has no prior cardiac history, case was discussed with cardiology on-call Dr. Sallyanne Kuster who reviewed the case.  She underwent serial cardiac enzymes which have remained  negative.  2D echo was obtained which showed normal EF and grade 2 diastolic dysfunction, no wall motion abnormalities.  She remained chest pain-free, able to ambulate without difficulties, and she was discharged home in stable condition and was advised to follow-up with PCP.  May benefit from outpatient coronary CT versus stress test. Esophageal spasms -She has a history of esophageal spasms, which also can respond to nitroglycerin and may benefit from outpatient GI follow-up as well.  Tonight prior to admission she did have chest discomfort after she had a larger than normal meal which tends to happen when she eats more Hypertension -Resume home medications Hypothyroidism -Resume Synthroid History of asthma -No wheezing, albuterol as needed    Discharge Diagnoses:  Active Problems:   Chest pain     Discharge Instructions   Allergies as of 03/27/2018      Reactions   Coffee Bean Extract [coffea Arabica] Itching, Other (See Comments)   Indigestion, Headache   Meperidine Hcl Nausea And Vomiting   demerol   Metronidazole Other (See Comments)   Flagyl- mouth ulcers    Tobacco [nicotiana Tabacum] Other (See Comments)   Smoke- Cannot ride in car with those who smoke d/t sensitivity   Triamterene Rash      Medication List    TAKE these medications   albuterol 108 (90 Base) MCG/ACT inhaler Commonly known as:  PROVENTIL HFA;VENTOLIN HFA Inhale 2 puffs into the lungs every 6 (six) hours as needed for wheezing or shortness of breath. Reported on 12/11/2015   aMILoride 5 MG tablet Commonly known as:  MIDAMOR Take  5 mg by mouth daily.   BIOTIN PO Take 1 capsule by mouth daily.   CITRACAL + D PO Take 1 tablet by mouth daily.   LEVOXYL 100 MCG tablet Generic drug:  levothyroxine Take 100 mcg by mouth daily before breakfast.   losartan 100 MG tablet Commonly known as:  COZAAR Take 100 mg by mouth daily.   meloxicam 7.5 MG tablet Commonly known as:  MOBIC Take 7.5 mg by mouth  as needed for pain.   omeprazole 20 MG capsule Commonly known as:  PRILOSEC Take 20 mg by mouth daily.   simvastatin 40 MG tablet Commonly known as:  ZOCOR Take 40 mg by mouth daily at 6 PM.   VITAMIN D (CHOLECALCIFEROL) PO Take 2,000 Units by mouth daily.      Follow-up Information    Derinda Late, MD. Schedule an appointment as soon as possible for a visit in 2 week(s).   Specialty:  Family Medicine Contact information: [place] [address] [phone]           Consultations:    Procedures/Studies:  2D echo  Study Conclusions - Left ventricle: The cavity size was normal. Wall thickness was normal. Systolic function was normal. The estimated ejection fraction was in the range of 55% to 60%. Wall motion was normal; there were no regional wall motion abnormalities. Features are consistent with a pseudonormal left ventricular filling pattern, with concomitant abnormal relaxation and increased filling pressure (grade 2 diastolic dysfunction). - Aortic valve: There was trivial regurgitation. - Mitral valve: Calcified annulus.  Impressions: - Normal LV systolic function; moderate diastolic dysfunction; trace AI and MR.   Dg Chest 2 View  Result Date: 03/27/2018 CLINICAL DATA:  77 y/o F; bilateral rib pain, minimal right jaw pain, chronic cough. EXAM: CHEST - 2 VIEW COMPARISON:  05/16/2014 chest radiograph. FINDINGS: Normal cardiac silhouette. Aortic atherosclerosis with calcification. Clear lungs. No pleural effusion or pneumothorax. No acute osseous abnormality is evident. IMPRESSION: No acute pulmonary process identified.  Aortic atherosclerosis. Electronically Signed   By: Kristine Garbe M.D.   On: 03/27/2018 06:36      Subjective: - no chest pain, shortness of breath, no abdominal pain, nausea or vomiting.   Discharge Exam: Vitals:   03/27/18 0921 03/27/18 2023  BP: (!) 131/109 (!) 161/97  Pulse: 61 78  Resp:  17  Temp:  97.8 F (36.6 C) 98.6 F (37 C)  SpO2: 99% 97%    General: Pt is alert, awake, not in acute distress Cardiovascular: RRR, S1/S2 +, no rubs, no gallops Respiratory: CTA bilaterally, no wheezing, no rhonchi Abdominal: Soft, NT, ND, bowel sounds + Extremities: no edema, no cyanosis    The results of significant diagnostics from this hospitalization (including imaging, microbiology, ancillary and laboratory) are listed below for reference.     Microbiology: No results found for this or any previous visit (from the past 240 hour(s)).   Labs: BNP (last 3 results) No results for input(s): BNP in the last 8760 hours. Basic Metabolic Panel: Recent Labs  Lab 03/27/18 0614  NA 139  K 4.0  CL 109  CO2 22  GLUCOSE 101*  BUN 23  CREATININE 1.09*  CALCIUM 8.4*   Liver Function Tests: Recent Labs  Lab 03/27/18 0614  AST 25  ALT 23  ALKPHOS 75  BILITOT 0.4  PROT 6.2*  ALBUMIN 3.7   Recent Labs  Lab 03/27/18 0614  LIPASE 52*   No results for input(s): AMMONIA in the last  168 hours. CBC: Recent Labs  Lab 03/27/18 0614  WBC 5.4  NEUTROABS 2.1  HGB 14.7  HCT 42.7  MCV 90.3  PLT 151   Cardiac Enzymes: Recent Labs  Lab 03/27/18 0614 03/27/18 1144 03/27/18 1839  TROPONINI <0.03 <0.03 <0.03   BNP: Invalid input(s): POCBNP CBG: No results for input(s): GLUCAP in the last 168 hours. D-Dimer No results for input(s): DDIMER in the last 72 hours. Hgb A1c No results for input(s): HGBA1C in the last 72 hours. Lipid Profile No results for input(s): CHOL, HDL, LDLCALC, TRIG, CHOLHDL, LDLDIRECT in the last 72 hours. Thyroid function studies No results for input(s): TSH, T4TOTAL, T3FREE, THYROIDAB in the last 72 hours.  Invalid input(s): FREET3 Anemia work up No results for input(s): VITAMINB12, FOLATE, FERRITIN, TIBC, IRON, RETICCTPCT in the last 72 hours. Urinalysis No results found for: COLORURINE, APPEARANCEUR, [place], [place], GLUCOSEU, HGBUR, BILIRUBINUR,  KETONESUR, PROTEINUR, UROBILINOGEN, NITRITE, LEUKOCYTESUR Sepsis Labs Invalid input(s): PROCALCITONIN,  WBC,  LACTICIDVEN   Time coordinating discharge: 25 minutes  SIGNED:  Marzetta Board, MD  [facility] 03/29/2018, 7:03 AM Pager [phone]  If 7PM-7AM, please contact night-coverage [email or web address] Password [email or web address]

## 2018-04-07 ENCOUNTER — Other Ambulatory Visit: Payer: Self-pay | Admitting: Family Medicine

## 2018-04-07 DIAGNOSIS — R1310 Dysphagia, unspecified: Secondary | ICD-10-CM

## 2018-04-09 ENCOUNTER — Ambulatory Visit
Admission: RE | Admit: 2018-04-09 | Discharge: 2018-04-09 | Disposition: A | Discharge status: Home / Self Care | Payer: Medicare Other | Source: Ambulatory Visit | Attending: Family Medicine | Admitting: Family Medicine

## 2018-04-09 DIAGNOSIS — R1310 Dysphagia, unspecified: Secondary | ICD-10-CM

## 2018-05-14 ENCOUNTER — Encounter: Payer: Self-pay | Admitting: Internal Medicine

## 2018-05-14 ENCOUNTER — Ambulatory Visit (INDEPENDENT_AMBULATORY_CARE_PROVIDER_SITE_OTHER): Payer: Medicare Other | Admitting: Internal Medicine

## 2018-05-14 VITALS — BP 120/78 | HR 80 | Ht 64.0 in | Wt 183.4 lb

## 2018-05-14 DIAGNOSIS — K219 Gastro-esophageal reflux disease without esophagitis: Secondary | ICD-10-CM | POA: Diagnosis not present

## 2018-05-14 DIAGNOSIS — R1013 Epigastric pain: Secondary | ICD-10-CM | POA: Diagnosis not present

## 2018-05-14 DIAGNOSIS — K222 Esophageal obstruction: Secondary | ICD-10-CM | POA: Diagnosis not present

## 2018-05-14 NOTE — Patient Instructions (Addendum)
  Adjust you omeprazole to once daily.   Follow up as needed with Dr Carlean Purl.    I appreciate the opportunity to care for you. Silvano Rusk, MD, [facility]

## 2018-05-14 NOTE — Progress Notes (Signed)
Kylie Taylor 77 y.o. December 14, 1940 [id number]  Assessment & Plan:   Encounter Diagnoses  Name Primary?  . Dyspepsia Yes  . GERD with stricture     The patient is convincing and that she feels like things are very similar as to where they were and she is not having the classic suprasternal sticking point dysphagia.  While I can understand that the sensation she is feeling in her epigastrium might seem like dysphagia I am not convinced that is, especially since it is not what she used to have in that situation where she had a food impaction have to regurgitate it.  That is not an issue now.  I think she just overeats and fills up sometimes and it takes a while to empty and it could be related to her hiatal hernia.  I do not think an upper endoscopy is necessary given what seems most likely to be a stable pattern of symptoms without other true warning signs.  CBC was normal.  I reviewed the barium swallow and images as well.  I suspect that the deviation in the cervical esophagus could well be spasm and tertiary contractions as opposed to a prominent thyroid but will defer to Dr. Sandi Mariscal for follow-up of that if thought necessary.  She will see me as needed.  I appreciate the opportunity to care for this patient. CC: Derinda Late, MD  Subjective:   Chief Complaint: Reflux question of dysphagia  HPI Kylie Taylor is here at the request of Dr. Sandi Mariscal because she was briefly in the hospital in July with some atypical chest pain symptoms.  Negative cardiac evaluation.  She described a feeling of food sitting in her lower chest or upper abdomen to him and he tested her with a barium swallow and tablet that showed a slightly larger hiatal hernia but the tablet passed without difficulty moderate tertiary contractions present.  Perhaps a slight indentation from the left of the lower cervical esophagus which could be due to prominent left lobe of thyroid.  She does not have classic suprasternal  sticking point dysphagia like she used to have, Dr. Maurene Capes did an esophageal dilation in 2012 and since that time she has not had those which she calls her "spasms".  She says if she overeats she gets full and feels pressure and she can feel the food then empty from her stomach later.  This is not a new pattern and there is no progressive early satiety and there is no unintentional weight loss.  Dr. Sandi Mariscal had put her on 20 mg twice daily omeprazole in the interim.  She would like to reduce that.  The chest pain symptoms she did have were mild she had pain on either side it was with rolling over and she thought she ought to go get evaluated in the emergency room and she did that on July 5 and was then observed, she feels like she had some of these upper epigastric symptoms that I am describing above prior to that that night.  I reviewed the hospital records I have reviewed Dr. Deboraha Sprang letter which kindly summarizes her problems and I reviewed her July 16 follow-up appointment note from Dr. Sandi Mariscal. Allergies  Allergen Reactions  . Atorvastatin Calcium Other (See Comments)    Abnormal liver function tests  . Coffee Bean Extract [Coffea Arabica] Itching and Other (See Comments)    Indigestion, Headache  . Demerol [Meperidine Hcl] Nausea And Vomiting  . Metronidazole Other (See Comments)    Flagyl- mouth ulcers   .  Tobacco [Nicotiana Tabacum] Other (See Comments)    Smoke- Cannot ride in car with those who smoke d/t sensitivity  . Triamterene Rash   Current Meds  Medication Sig  . albuterol (PROVENTIL HFA;VENTOLIN HFA) 108 (90 Base) MCG/ACT inhaler Inhale 2 puffs into the lungs every 6 (six) hours as needed for wheezing or shortness of breath. Reported on 12/11/2015  . aMILoride (MIDAMOR) 5 MG tablet Take 5 mg by mouth daily.  Marland Kitchen BIOTIN PO Take 1 capsule by mouth daily.  . Calcium Citrate-Vitamin D (CITRACAL + D PO) Take 1 tablet by mouth daily.   Marland Kitchen levothyroxine (LEVOXYL) 100 MCG tablet Take  100 mcg by mouth daily before breakfast.   . losartan (COZAAR) 100 MG tablet Take 100 mg by mouth daily.  . meloxicam (MOBIC) 7.5 MG tablet Take 7.5 mg by mouth as needed for pain.   Marland Kitchen omeprazole (PRILOSEC) 20 MG capsule Take 20 mg by mouth daily.   . simvastatin (ZOCOR) 40 MG tablet Take 40 mg by mouth daily at 6 PM.  . VITAMIN D, CHOLECALCIFEROL, PO Take 2,000 Units by mouth daily.    Past Medical History:  Diagnosis Date  . Allergy    seasonal  . Angiomyolipoma   . Anxiety   . Asthma   . 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 Benign positional vertigo   . Bilateral bunions    and pronated feet  . Carpal tunnel syndrome   . Cataract   . DDD (degenerative disc disease)   . Diverticulosis    2007 colon  . Dysphagia   . Food impaction of esophagus    multiple  . GERD (gastroesophageal reflux disease)   . Hiatal hernia   . Hx of adenomatous polyp of colon 01/03/2016  . Hyperlipemia   . Hypertension   . Hypothyroidism   . Lumbar radiculopathy    recurrent since 1996  . Multiple gastric polyps   . Osteopenia   . Rosacea   . Small vessel disease, cerebrovascular    dx in 2012  . Tick bites    2017- taking Doxycycline for this   Past Surgical History:  Procedure Laterality Date  . COLONOSCOPY    . DILATION AND CURETTAGE OF UTERUS    . MOUTH SURGERY    . PARTIAL HYSTERECTOMY     for fibroids  . UPPER GASTROINTESTINAL ENDOSCOPY     Social History   Social History Narrative   She is retired.  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   She is divorced and lives alone in the same how she grew up in.  Active in church.   Enjoys a Medical laboratory scientific officer in [place].   Never smoker.   1-2 drinks per month.  No substance use.   family history includes Alzheimer's disease in her mother; Anemia in her mother; Barrett's esophagus in her father; Brain cancer in her maternal grandmother, paternal aunt, and paternal uncle; Breast cancer in her unknown relative; Heart attack  in her father; Hypertension in her father, mother, and sister; Hypothyroidism in her mother and sister; Migraines in her brother, father, and sister; Other in her brother, sister, and son; Peptic Ulcer Disease in her father; Polymyalgia rheumatica in her mother; Prostate cancer in her unknown relative; Sjogren's syndrome in her sister.   Review of Systems As per HPI.  Objective:   Physical Exam BP 120/78   Pulse 80   Ht 5\' 4"  (1.626 m)   Wt 183  lb 6.4 oz (83.2 kg)   BMI 31.48 kg/m  NAD Obese ww Eyes anicteric Lungs CTA Cor s1s2 no rmg abd obese and [place] Alert and oriented Appropriate affect

## 2019-04-03 IMAGING — RF DG ESOPHAGUS
7 series · 14 of 24 positions shown · non-contrast
Comparison: Barium swallow of 02/19/2011

CLINICAL DATA: Dysphagia

EXAM:
ESOPHOGRAM / BARIUM SWALLOW / BARIUM TABLET STUDY
TECHNIQUE: Combined double contrast and single contrast examination performed
using effervescent crystals, thick barium liquid, and thin barium
liquid. The patient was observed with fluoroscopy swallowing a 13 mm
barium sulphate tablet.
FLUOROSCOPY TIME:  Fluoroscopy Time:  1 minutes 24 seconds
Radiation Exposure Index (if provided by the fluoroscopic device):
185 mGy
Number of Acquired Spot Images: 0

[Series 1: sequence · 0.28mm/px · 2 of 20 frames shown (1 of 6)]
[frame 4/20]
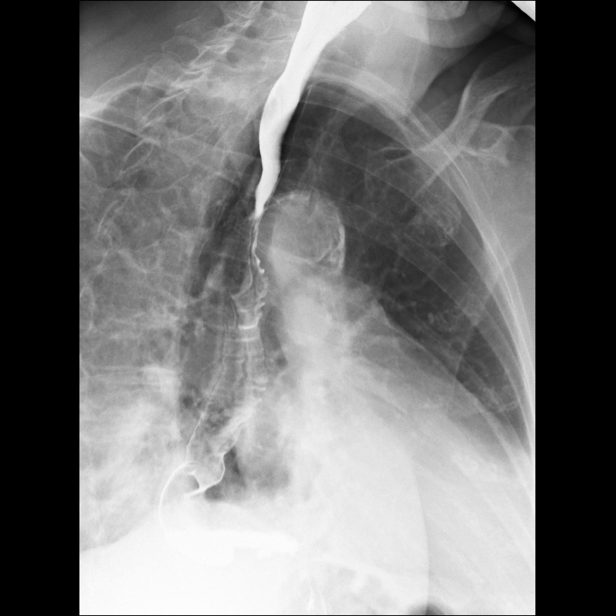
[frame 17/20]
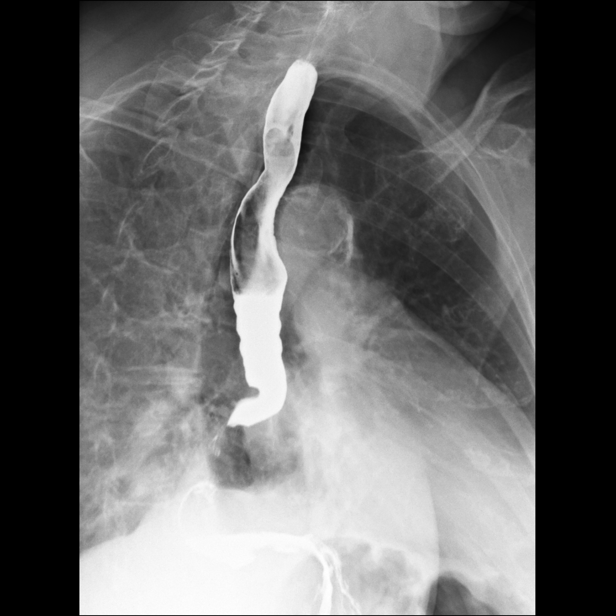

[Series 2: sequence · 0.28mm/px · 2 of 11 frames shown (2 of 6)]
[frame 5/11]
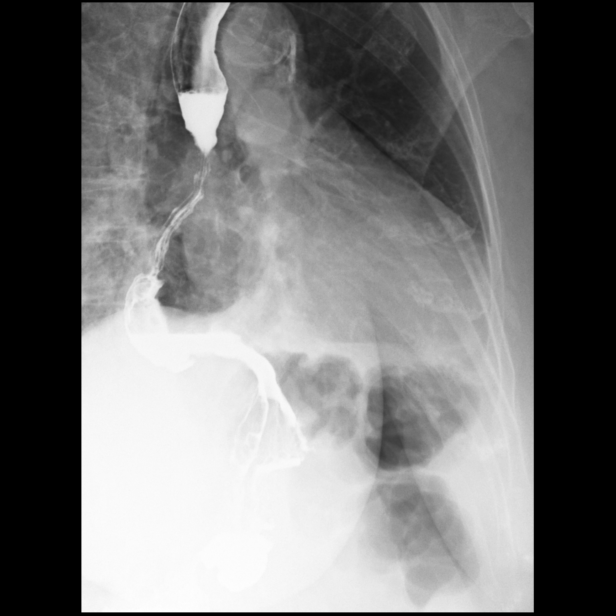
[frame 10/11]
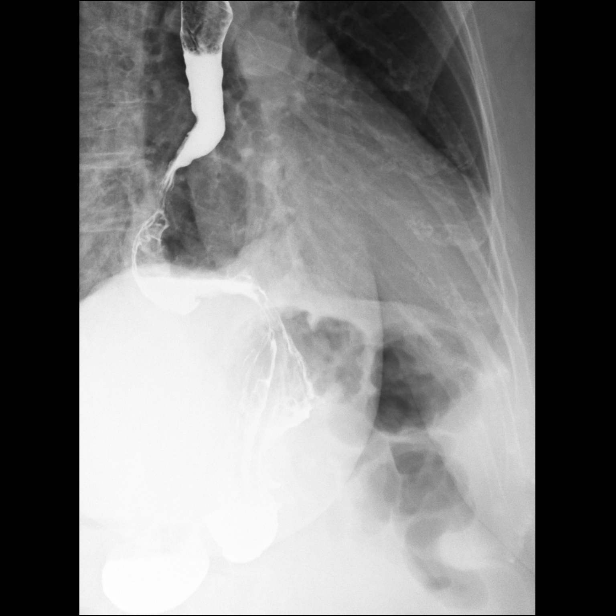

[Series 3: sequence · 0.28mm/px · 2 of 40 frames shown (3 of 6)]
[frame 2/40]
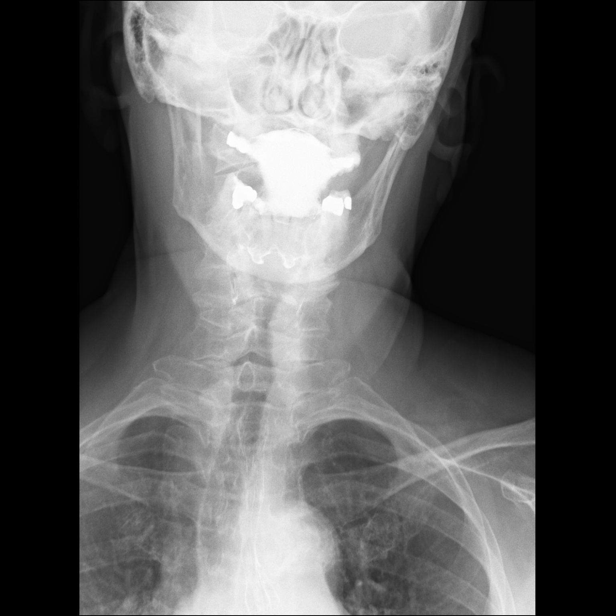
[frame 35/40]
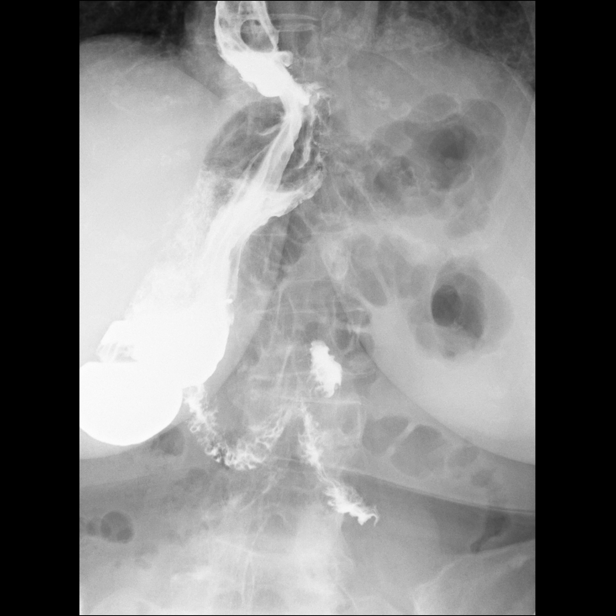

[Series 4: sequence · 0.28mm/px · 2 of 14 frames shown (4 of 6)]
[frame 8/14]
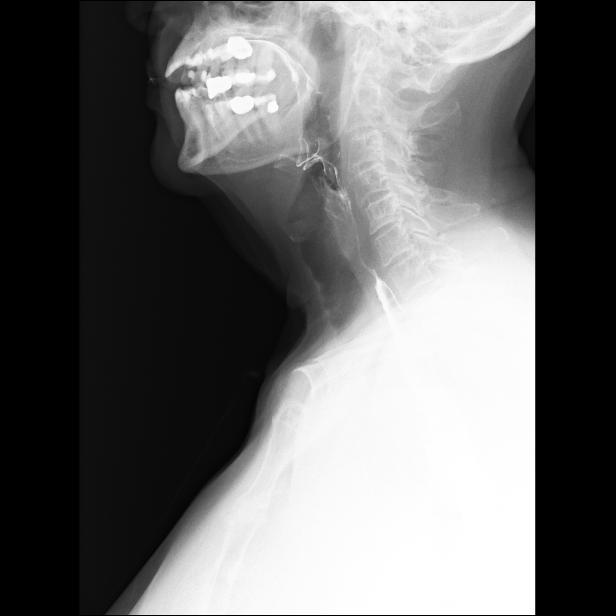
[frame 10/14]
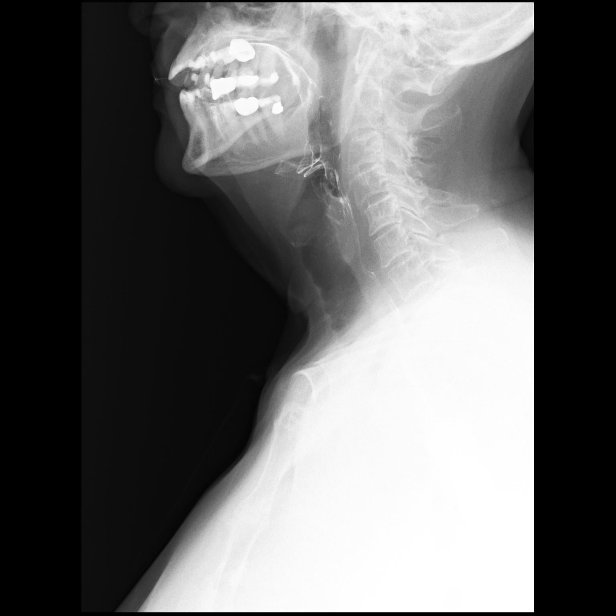

[Series 5: sequence · 0.28mm/px · 2 of 20 frames shown (5 of 6)]
[frame 4/20]
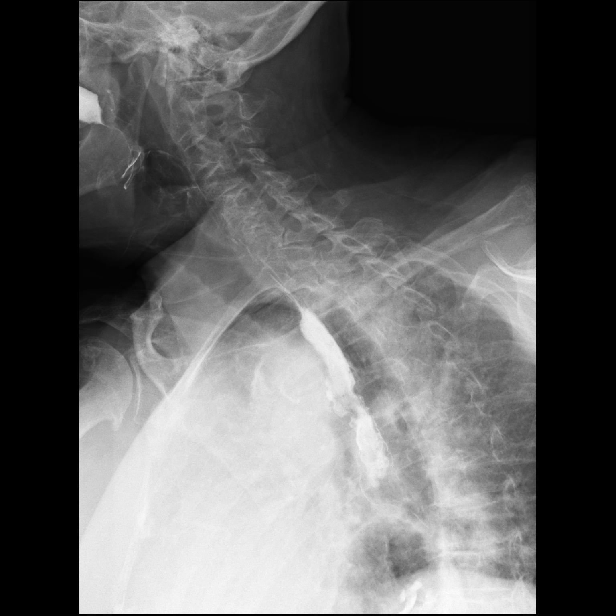
[frame 18/20]
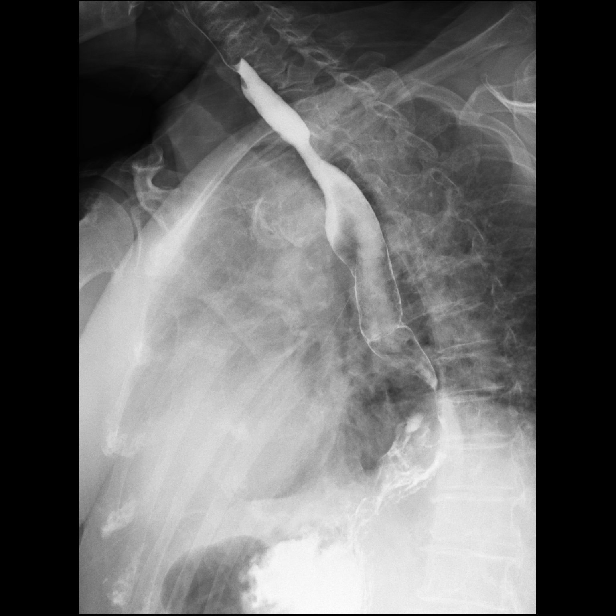

[Series 6: sequence · 2 of 24 frames shown (6 of 6)]
[frame 13/24]
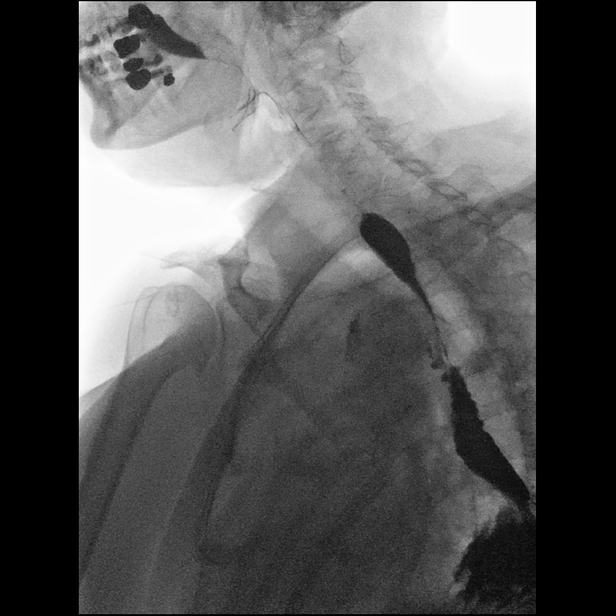
[frame 17/24]
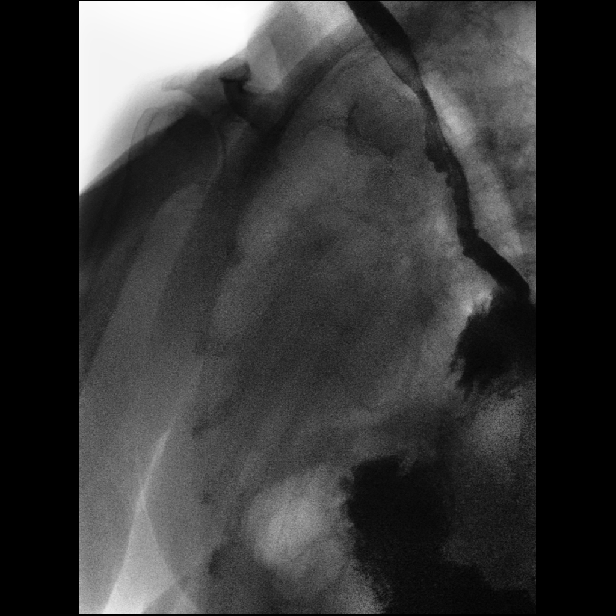

[Series 7: one shot · 0.14mm/px · 2 of 4 slices shown]
[im 2/4]
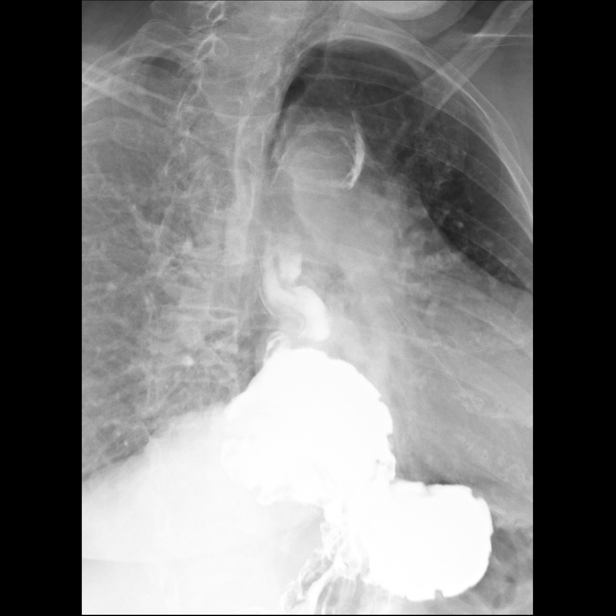
[im 4/4]
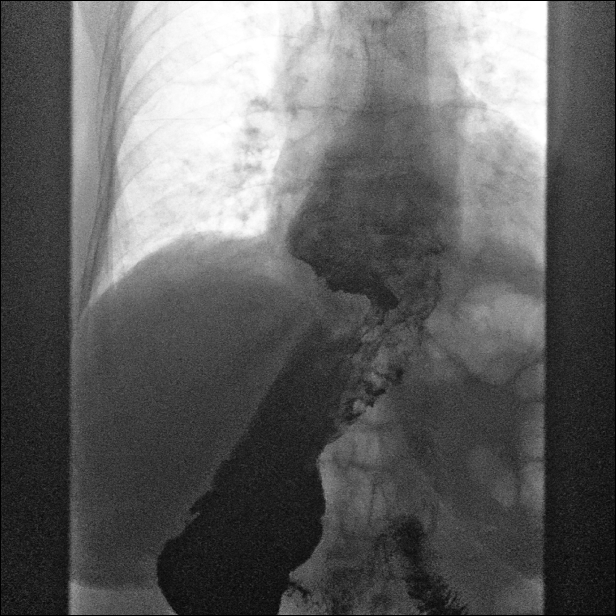

[14 of 24 positions shown; findings below may reference images not displayed]

FINDINGS: Initially double-contrast barium swallow was performed. The mucosa
of the esophagus appears unremarkable. Moderate tertiary
contractions are present. Rapid sequence spot films of the cervical
esophagus show perhaps very slight indentation of from the left of
the lower cervical esophagus which could be due to prominent left
lobe of thyroid. Clinical correlation is recommended. No penetration
or aspiration is seen. There is a small to moderate size hiatal
hernia present has increased in size since the exam of 1531 but
still remains small to moderate in size. Moderate gastroesophageal
reflux is demonstrated with the water siphon maneuver. A barium pill
was given at the end the study which did pass into the hernia and
into the stomach without delay.
IMPRESSION: 1. Interval increase in size of small to moderate size hiatal hernia
since the prior study of 1531.
2. Moderate gastroesophageal reflux. Barium pill passes into the
stomach without delay.
3. Moderate tertiary contractions in the mid and distal esophagus.
4. Slight indentation upon the lower cervical esophagus from the
left. Possible prominent left thyroid gland. Correlate clinically.

## 2019-07-02 ENCOUNTER — Other Ambulatory Visit: Payer: Self-pay

## 2019-07-02 DIAGNOSIS — Z20822 Contact with and (suspected) exposure to covid-19: Secondary | ICD-10-CM

## 2019-07-03 LAB — NOVEL CORONAVIRUS, NAA: SARS-CoV-2, NAA: NOT DETECTED

## 2020-10-16 ENCOUNTER — Ambulatory Visit: Payer: Self-pay

## 2020-10-16 ENCOUNTER — Ambulatory Visit (INDEPENDENT_AMBULATORY_CARE_PROVIDER_SITE_OTHER): Payer: Medicare Other | Admitting: Physician Assistant

## 2020-10-16 ENCOUNTER — Encounter: Payer: Self-pay | Admitting: Orthopedic Surgery

## 2020-10-16 VITALS — Ht 62.5 in | Wt 178.0 lb

## 2020-10-16 DIAGNOSIS — M25562 Pain in left knee: Secondary | ICD-10-CM

## 2020-10-16 DIAGNOSIS — G8929 Other chronic pain: Secondary | ICD-10-CM

## 2020-10-16 MED ORDER — METHYLPREDNISOLONE ACETATE 40 MG/ML IJ SUSP
40.0000 mg | INTRAMUSCULAR | Status: AC | PRN
  Administered 2020-10-16: 40 mg via INTRA_ARTICULAR

## 2020-10-16 MED ORDER — LIDOCAINE HCL 1 % IJ SOLN
5.0000 mL | INTRAMUSCULAR | Status: AC | PRN
  Administered 2020-10-16: 5 mL

## 2020-10-16 NOTE — Progress Notes (Signed)
Office Visit Note   Patient: Kylie Taylor           Date of Birth: 1941/09/05           MRN: [id number] Visit Date: 10/16/2020              Requested by: Derinda Late, MD [address] [place],  [place] [address] PCP: Derinda Late, MD  Chief Complaint  Patient presents with  . Left Knee - Pain      HPI: Patient is a pleasant 80 year old woman with a chief complaint of left lateral knee pain.  She states she has seen Dr. Sharol Given in the past in 2017 and has had issues with both of her knees.  She has responded well to steroid injections.  She denies any trauma.  She tries to stay as active as possible  Assessment & Plan: Visit Diagnoses:  1. Chronic pain of left knee     Plan: Left knee valgus arthritis.  We will go forward with an injection today.  She will contact us if this is not improved.  Could be a candidate for hyaluronic acid injections if the steroid injection is not long-lasting  Follow-Up Instructions: No follow-ups on file.   Ortho Exam  Patient is alert, oriented, no adenopathy, well-dressed, normal affect, normal respiratory effort. Left knee: No effusion she does have quite a few varicosities in her lower legs but compartments are soft and nontender.  She has valgus malalignment.  Tenderness over the lateral and posterior lateral joint line.  Some crepitus with range of motion some pain with terminal extension  Imaging: No results found. No images are attached to the encounter.  Labs: No results found for: HGBA1C, ESRSEDRATE, CRP, LABURIC, REPTSTATUS, GRAMSTAIN, CULT, LABORGA   Lab Results  Component Value Date   ALBUMIN 3.7 03/27/2018    No results found for: MG No results found for: VD25OH  No results found for: PREALBUMIN CBC EXTENDED Latest Ref Rng & Units 03/27/2018 02/03/2011  WBC 4.0 - 10.5 K/uL 5.4 6.6  RBC 3.87 - 5.11 MIL/uL 4.73 5.22(H)  HGB 12.0 - 15.0 g/dL 14.7 15.8(H)  HCT 36.0 - 46.0 % 42.7 45.7  PLT 150 - 400 K/uL 151 183   NEUTROABS 1.7 - 7.7 K/uL 2.1 3.8  LYMPHSABS 0.7 - 4.0 K/uL 2.3 2.0     Body mass index is 32.04 kg/m.  Orders:  Orders Placed This Encounter  Procedures  . XR KNEE 3 VIEW LEFT   No orders of the defined types were placed in this encounter.    Procedures: Large Joint Inj on 10/16/2020 10:09 AM Indications: pain and diagnostic evaluation Details: 22 G 1.5 in needle, anteromedial approach  Arthrogram: No  Medications: 40 mg methylPREDNISolone acetate 40 MG/ML; 5 mL lidocaine 1 % Outcome: tolerated well, no immediate complications Procedure, treatment alternatives, risks and benefits explained, specific risks discussed. Consent was given by the patient.      Clinical Data: No additional findings.  ROS:  All other systems negative, except as noted in the HPI. Review of Systems  Objective: Vital Signs: Ht 5' 2.5" (1.588 m)   Wt 178 lb (80.7 kg)   BMI 32.04 kg/m   Specialty Comments:  No specialty comments available.  PMFS History: Patient Active Problem List   Diagnosis Date Noted  . Chest pain 03/27/2018  . Bilateral primary osteoarthritis of knee 08/27/2016  . Hx of adenomatous polyp of colon 01/03/2016  . HYPOTHYROIDISM 02/17/2009  . HYPERLIPIDEMIA 02/17/2009  .  ANXIETY 02/17/2009  . GERD 02/17/2009  . DIVERTICULOSIS OF COLON 02/17/2009  . DEGENERATIVE DISC DISEASE 02/17/2009  . DYSPHAGIA UNSPECIFIED 02/17/2009   Past Medical History:  Diagnosis Date  . Allergy    seasonal  . Angiomyolipoma   . Anxiety   . Asthma   . 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 Benign positional vertigo   . Bilateral bunions    and pronated feet  . Carpal tunnel syndrome   . Cataract   . DDD (degenerative disc disease)   . Diverticulosis    2007 colon  . Dysphagia   . Food impaction of esophagus    multiple  . GERD (gastroesophageal reflux disease)   . Hiatal hernia   . Hx of adenomatous polyp of colon 01/03/2016  . Hyperlipemia   . Hypertension   .  Hypothyroidism   . Lumbar radiculopathy    recurrent since 1996  . Multiple gastric polyps   . Osteopenia   . Rosacea   . Small vessel disease, cerebrovascular    dx in 2012  . Tick bites    2017- taking Doxycycline for this    Family History  Problem Relation Age of Onset  . Breast cancer Unknown        aunt  . Prostate cancer Unknown        grandfather/uncle  . Barrett's esophagus Father   . Heart attack Father        died with this age 74  . Migraines Father   . Hypertension Father   . Peptic Ulcer Disease Father   . Alzheimer's disease Mother        died at age 5 with this and pneumonia  . Anemia Mother   . Hypothyroidism Mother   . Hypertension Mother   . Polymyalgia rheumatica Mother   . Migraines Sister   . Hypothyroidism Sister   . Hypertension Sister   . Sjogren's syndrome Sister   . Other Sister        cerebral hemorrhage and sepsis, died with these causes at 41  . Migraines Brother   . Other Brother        prostate disease and degenerative spine disorder  . Brain cancer Maternal Grandmother   . Other Son        degenerative disease of the lumbar spine  . Brain cancer Paternal Aunt   . Brain cancer Paternal Uncle   . Colon cancer Neg Hx   . Colon polyps Neg Hx   . Rectal cancer Neg Hx   . Stomach cancer Neg Hx   . Esophageal cancer Neg Hx     Past Surgical History:  Procedure Laterality Date  . COLONOSCOPY    . DILATION AND CURETTAGE OF UTERUS    . MOUTH SURGERY    . PARTIAL HYSTERECTOMY     for fibroids  . UPPER GASTROINTESTINAL ENDOSCOPY     Social History   Occupational History  . Occupation: retired  Tobacco Use  . Smoking status: Never Smoker  . Smokeless tobacco: Never Used  Vaping Use  . Vaping Use: Never used  Substance and Sexual Activity  . Alcohol use: Yes    Alcohol/week: 0.0 standard drinks    Comment: occasional  . Drug use: No  . Sexual activity: Not on file

## 2020-10-23 ENCOUNTER — Telehealth: Payer: Self-pay

## 2020-10-23 NOTE — Telephone Encounter (Signed)
Pt called in stating that the shot helped,  She is better than she was it isn't pain free but it has very much improved.  She is wondering what the next step will be for her if it stats to regress can she get the shot again.

## 2020-10-24 NOTE — Telephone Encounter (Signed)
I called pt and advised of message below. Will call with any other questions or if any injections are needed in the future.

## 2020-10-24 NOTE — Telephone Encounter (Signed)
Pt is s/p cortisone injection. Is it ok to advise can have every three months if needed?

## 2020-10-24 NOTE — Telephone Encounter (Signed)
Yes thanks 

## 2021-03-21 ENCOUNTER — Encounter: Payer: Self-pay | Admitting: Physician Assistant

## 2021-03-21 ENCOUNTER — Ambulatory Visit (INDEPENDENT_AMBULATORY_CARE_PROVIDER_SITE_OTHER): Payer: Medicare Other | Admitting: Physician Assistant

## 2021-03-21 ENCOUNTER — Other Ambulatory Visit: Payer: Self-pay

## 2021-03-21 DIAGNOSIS — M25562 Pain in left knee: Secondary | ICD-10-CM | POA: Diagnosis not present

## 2021-03-21 DIAGNOSIS — G8929 Other chronic pain: Secondary | ICD-10-CM | POA: Diagnosis not present

## 2021-03-21 MED ORDER — LIDOCAINE HCL 1 % IJ SOLN
5.0000 mL | INTRAMUSCULAR | Status: AC | PRN
  Administered 2021-03-21: 5 mL

## 2021-03-21 MED ORDER — METHYLPREDNISOLONE ACETATE 40 MG/ML IJ SUSP
40.0000 mg | INTRAMUSCULAR | Status: AC | PRN
  Administered 2021-03-21: 40 mg via INTRA_ARTICULAR

## 2021-03-21 NOTE — Progress Notes (Signed)
Office Visit Note   Patient: Kylie Taylor           Date of Birth: 1940/10/25           MRN: [id number] Visit Date: 03/21/2021              Requested by: Derinda Late, MD [address] [place],  [place] [address] PCP: Derinda Late, MD  Chief Complaint  Patient presents with   Left Knee - Pain      HPI: Patient is here requesting another injection into her left knee.  She has a history of arthritis and had a last injection in January this helped her quite a bit.  No new injuries just a return of her symptoms  Assessment & Plan: Visit Diagnoses: No diagnosis found.  Plan: Injection was given follow-up as needed  Follow-Up Instructions: No follow-ups on file.   Ortho Exam  Patient is alert, oriented, no adenopathy, well-dressed, normal affect, normal respiratory effort. Left knee no effusion no swelling she has grinding with range of motion tender over the patellofemoral joint medial lateral joint line no sign of infection  Imaging: No results found. No images are attached to the encounter.  Labs: No results found for: HGBA1C, ESRSEDRATE, CRP, LABURIC, REPTSTATUS, GRAMSTAIN, CULT, LABORGA   Lab Results  Component Value Date   ALBUMIN 3.7 03/27/2018    No results found for: MG No results found for: VD25OH  No results found for: PREALBUMIN CBC EXTENDED Latest Ref Rng & Units 03/27/2018 02/03/2011  WBC 4.0 - 10.5 K/uL 5.4 6.6  RBC 3.87 - 5.11 MIL/uL 4.73 5.22(H)  HGB 12.0 - 15.0 g/dL 14.7 15.8(H)  HCT 36.0 - 46.0 % 42.7 45.7  PLT 150 - 400 K/uL 151 183  NEUTROABS 1.7 - 7.7 K/uL 2.1 3.8  LYMPHSABS 0.7 - 4.0 K/uL 2.3 2.0     There is no height or weight on file to calculate BMI.  Orders:  No orders of the defined types were placed in this encounter.  No orders of the defined types were placed in this encounter.    Procedures: Large Joint Inj: L knee on 03/21/2021 2:06 PM Indications: pain and diagnostic evaluation Details: 22 G 1.5 in needle,  anteromedial approach  Arthrogram: No  Medications: 40 mg methylPREDNISolone acetate 40 MG/ML; 5 mL lidocaine 1 % Outcome: tolerated well, no immediate complications Procedure, treatment alternatives, risks and benefits explained, specific risks discussed. Consent was given by the patient.    Clinical Data: No additional findings.  ROS:  All other systems negative, except as noted in the HPI. Review of Systems  Objective: Vital Signs: There were no vitals taken for this visit.  Specialty Comments:  No specialty comments available.  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   Benign positional vertigo    Bilateral bunions    and pronated feet   Carpal tunnel syndrome    Cataract    DDD (  degenerative disc disease)    Diverticulosis    2007 colon   Dysphagia    Food impaction of esophagus    multiple   GERD (gastroesophageal reflux disease)    Hiatal hernia    Hx of adenomatous polyp of colon 01/03/2016   Hyperlipemia    Hypertension    Hypothyroidism    Lumbar radiculopathy    recurrent since 1996   Multiple gastric polyps    Osteopenia    Rosacea    Small vessel disease, cerebrovascular    dx in 2012   Tick bites    2017- taking Doxycycline for this    Family History  Problem Relation Age of Onset   Breast cancer Unknown        aunt   Prostate cancer Unknown        grandfather/uncle   Barrett's esophagus Father    Heart attack Father        died with this age  2   Migraines Father    Hypertension Father    Peptic Ulcer Disease Father    Alzheimer's disease Mother        died at age 45 with this and pneumonia   Anemia Mother    Hypothyroidism Mother    Hypertension Mother    Polymyalgia rheumatica Mother    Migraines Sister    Hypothyroidism Sister    Hypertension Sister    Sjogren's syndrome Sister    Other Sister        cerebral hemorrhage and sepsis, died with these causes at 47   Migraines Brother    Other Brother        prostate disease and degenerative spine disorder   Brain cancer Maternal Grandmother    Other Son        degenerative disease of the lumbar spine   Brain cancer Paternal Aunt    Brain cancer Paternal Uncle    Colon cancer Neg Hx    Colon polyps Neg Hx    Rectal cancer Neg Hx    Stomach cancer Neg Hx    Esophageal cancer Neg Hx     Past Surgical History:  Procedure Laterality Date   COLONOSCOPY     DILATION AND CURETTAGE OF UTERUS     MOUTH SURGERY     PARTIAL HYSTERECTOMY     for fibroids   UPPER GASTROINTESTINAL ENDOSCOPY     Social History   Occupational History   Occupation: retired  Tobacco Use   Smoking status: Never   Smokeless tobacco: Never  Vaping Use   Vaping Use: Never used  Substance and Sexual Activity   Alcohol use: Yes    Alcohol/week: 0.0 standard drinks    Comment: occasional   Drug use: No   Sexual activity: Not on file

## 2021-06-04 ENCOUNTER — Other Ambulatory Visit: Payer: Self-pay | Admitting: Family Medicine

## 2021-06-05 LAB — SARS CORONAVIRUS 2 (TAT 6-24 HRS): SARS Coronavirus 2: NEGATIVE

## 2021-07-06 ENCOUNTER — Other Ambulatory Visit: Payer: Self-pay

## 2021-07-09 ENCOUNTER — Encounter: Payer: Self-pay | Admitting: Family Medicine

## 2021-07-10 ENCOUNTER — Telehealth: Payer: Self-pay | Admitting: Hematology and Oncology

## 2021-07-10 NOTE — Telephone Encounter (Signed)
SPOKE TO PATIENT TO [facility] ON 11/2, will call when it gets closer to time to remind patient of appointment

## 2021-07-19 ENCOUNTER — Encounter: Payer: Self-pay | Admitting: *Deleted

## 2021-07-19 DIAGNOSIS — D0512 Intraductal carcinoma in situ of left breast: Secondary | ICD-10-CM

## 2021-07-24 NOTE — Progress Notes (Signed)
[facility] CONSULT NOTE  Patient Care Team: Derinda Late, MD as PCP - General (Family Medicine) Rockwell Germany, RN as Oncology Nurse Navigator Tressie Ellis, Paulette Blanch, RN as Oncology Nurse Navigator Jovita Kussmaul, MD as Consulting Physician (General Surgery) Nicholas Lose, MD as Consulting Physician (Hematology and Oncology) Kyung Rudd, MD as Consulting Physician (Radiation Oncology)  CHIEF COMPLAINTS/PURPOSE OF CONSULTATION:  Newly diagnosed breast cancer  HISTORY OF PRESENTING ILLNESS:  Kylie Taylor 80 y.o. female is here because of recent diagnosis of calcifications of the left breast. Screening mammogram on 12/04/2020 showed indeterminate grouped calcifications in the left breast. Diagnostic mammogram and [place] on 12/15/2020 showed 1.5 cm fine linear calcifications in the left breast. She presents to the clinic today for initial evaluation and discussion of treatment options.   I reviewed her records extensively and collaborated the history with the patient.  SUMMARY OF ONCOLOGIC HISTORY: Oncology History  Ductal carcinoma in situ (DCIS) of left breast  07/19/2021 Initial Diagnosis   Screening mammogram: indeterminate grouped calcifications in the left breast. Diagnostic mammogram and [place]: 1.5 cm fine linear calcifications in the left breast.  Biopsy revealed high-grade DCIS ER 100%, PR 0%   07/25/2021 Cancer Staging   Staging form: Breast, AJCC 8th Edition - Clinical stage from 07/25/2021: Stage 0 (cTis (DCIS), cN0, cM0, G3, ER+, PR-) - Signed by Nicholas Lose, MD on 07/25/2021 Stage prefix: Initial diagnosis Nuclear grade: G3 Histologic grading system: 3 grade system      MEDICAL HISTORY:  Past Medical History:  Diagnosis Date   Allergy    seasonal   Angiomyolipoma    Anxiety    Asthma    Benign brain tumor ([place])    x3   Benign positional vertigo    Bilateral bunions    and pronated feet   Carpal tunnel syndrome    Cataract    DDD (degenerative disc  disease)    Diverticulosis    2007 colon   Dysphagia    Food impaction of esophagus    multiple   GERD (gastroesophageal reflux disease)    Hiatal hernia    Hx of adenomatous polyp of colon 01/03/2016   Hyperlipemia    Hypertension    Hypothyroidism    Lumbar radiculopathy    recurrent since 1996   Multiple gastric polyps    Osteopenia    Rosacea    Small vessel disease, cerebrovascular    dx in 2012   Tick bites    2017- taking Doxycycline for this    SURGICAL HISTORY: Past Surgical History:  Procedure Laterality Date   COLONOSCOPY     DILATION AND CURETTAGE OF UTERUS     MOUTH SURGERY     PARTIAL HYSTERECTOMY     for fibroids   UPPER GASTROINTESTINAL ENDOSCOPY      SOCIAL HISTORY: Social History   Socioeconomic History   Marital status: Divorced    Spouse name: Not on file   Number of children: Not on file   Years of education: Not on file   Highest education level: Not on file  Occupational History   Occupation: retired  Tobacco Use   Smoking status: Never   Smokeless tobacco: Never  Vaping Use   Vaping Use: Never used  Substance and Sexual Activity   Alcohol use: Yes    Alcohol/week: 0.0 standard drinks    Comment: occasional   Drug use: No   Sexual activity: Not on file  Other Topics Concern   Not on file  Social History Narrative   She is retired.  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   She is divorced and lives alone in the same how she grew up in.  Active in church.   Enjoys a Medical laboratory scientific officer in [place].   Never smoker.   1-2 drinks per month.  No substance use.   Social Determinants of Radio broadcast assistant Strain: Not on file  Food Insecurity: Not on file  Transportation Needs: Not on file  Physical Activity: Not on file  Stress: Not on file  Social Connections: Not on file  Intimate Partner Violence: Not on file    FAMILY HISTORY: Family History  Problem Relation Age of Onset   Alzheimer's disease  Mother        died at age 21 with this and pneumonia   Anemia Mother    Hypothyroidism Mother    Hypertension Mother    Polymyalgia rheumatica Mother    Barrett's esophagus Father    Heart attack Father        died with this age 10   Migraines Father    Hypertension Father    Peptic Ulcer Disease Father    Migraines Sister    Hypothyroidism Sister    Hypertension Sister    Sjogren's syndrome Sister    Other Sister        cerebral hemorrhage and sepsis, died with these causes at 27   Migraines Brother    Other Brother        prostate disease and degenerative spine disorder   Brain cancer Maternal Grandmother    Other Son        degenerative disease of the lumbar spine   Brain cancer Paternal Aunt    Breast cancer Paternal Aunt    Brain cancer Paternal Uncle    Breast cancer Other        aunt   Prostate cancer Other        grandfather/uncle   Colon cancer Neg Hx    Colon polyps Neg Hx    Rectal cancer Neg Hx    Stomach cancer Neg Hx    Esophageal cancer Neg Hx     ALLERGIES:  is allergic to atorvastatin calcium, coffee bean extract [coffea arabica], demerol [meperidine hcl], metronidazole, tobacco [tobacco], and triamterene.  MEDICATIONS:  Current Outpatient Medications  Medication Sig Dispense Refill   albuterol (PROVENTIL HFA;VENTOLIN HFA) 108 (90 Base) MCG/ACT inhaler Inhale 2 puffs into the lungs every 6 (six) hours as needed for wheezing or shortness of breath. Reported on 12/11/2015     aMILoride (MIDAMOR) 5 MG tablet Take 5 mg by mouth daily.     BIOTIN PO Take 1 capsule by mouth daily.     Calcium Citrate-Vitamin D (CITRACAL + D PO) Take 1 tablet by mouth daily.      levothyroxine (SYNTHROID) 100 MCG tablet Take 100 mcg by mouth daily before breakfast.     losartan (COZAAR) 100 MG tablet Take 100 mg by mouth daily.     meloxicam (MOBIC) 7.5 MG tablet Take 7.5 mg by mouth as needed for pain.      omeprazole (PRILOSEC) 20 MG capsule Take 20 mg by mouth daily.      simvastatin (ZOCOR) 40 MG tablet Take 40 mg by mouth daily at 6 PM.     VITAMIN D, CHOLECALCIFEROL, PO Take 2,000 Units by mouth daily.     No current facility-administered medications for this visit.  REVIEW OF SYSTEMS:   Constitutional: Denies fevers, chills or abnormal night sweats Eyes: Denies blurriness of vision, double vision or watery eyes Ears, nose, mouth, throat, and face: Denies mucositis or sore throat Respiratory: Denies cough, dyspnea or wheezes Cardiovascular: Denies palpitation, chest discomfort or lower extremity swelling Gastrointestinal:  Denies nausea, heartburn or change in bowel habits Skin: Denies abnormal skin rashes Lymphatics: Denies new lymphadenopathy or easy bruising Neurological:Denies numbness, tingling or new weaknesses Behavioral/Psych: Mood is stable, no new changes  Breast:  Denies any palpable lumps or discharge All other systems were reviewed with the patient and are negative.  PHYSICAL EXAMINATION: ECOG PERFORMANCE STATUS: 1 - Symptomatic but completely ambulatory  Vitals:   07/25/21 1250  BP: (!) 143/72  Pulse: (!) 57  Resp: 18  Temp: 97.7 F (36.5 C)  SpO2: 99%   Filed Weights   07/25/21 1250  Weight: 168 lb 1.6 oz (76.2 kg)     LABORATORY DATA:  I have reviewed the data as listed Lab Results  Component Value Date   WBC 4.3 07/25/2021   HGB 13.7 07/25/2021   HCT 39.4 07/25/2021   MCV 88.7 07/25/2021   PLT 166 07/25/2021   Lab Results  Component Value Date   NA 140 07/25/2021   K 3.8 07/25/2021   CL 108 07/25/2021   CO2 23 07/25/2021    RADIOGRAPHIC STUDIES: I have personally reviewed the radiological reports and agreed with the findings in the report.  ASSESSMENT AND PLAN:  Ductal carcinoma in situ (DCIS) of left breast Screening mammogram: indeterminate grouped calcifications in the left breast. Diagnostic mammogram and [place]: 1.5 cm fine linear calcifications in the left breast.  Biopsy revealed high-grade DCIS ER  100%, PR 0%  Pathology review: I discussed with the patient the difference between DCIS and invasive breast cancer. It is considered a precancerous lesion. DCIS is classified as a 0. It is generally detected through mammograms as calcifications. We discussed the significance of grades and its impact on prognosis. We also discussed the importance of ER and PR receptors and their implications to adjuvant treatment options. Prognosis of DCIS dependence on grade, comedo necrosis. It is anticipated that if not treated, 20-30% of DCIS can develop into invasive breast cancer.  Recommendation: 1. Breast conserving surgery 2. +/- adjuvant radiation therapy 3. Followed by antiestrogen therapy with tamoxifen 5 years  Tamoxifen counseling: We discussed the risks and benefits of tamoxifen. These include but not limited to insomnia, hot flashes, mood changes, vaginal dryness, and weight gain. Although rare, serious side effects  incl risk of blood clots were also discussed. We strongly believe that the benefits far outweigh the risks. Patient understands these risks and consented to starting treatment. Planned treatment duration is 5 years.  Return to clinic after surgery to discuss the final pathology report and come up with an adjuvant treatment plan.    All questions were answered. The patient knows to call the clinic with any problems, questions or concerns.   Rulon Eisenmenger, MD, MPH 07/25/2021    I, Thana Ates, am acting as scribe for Nicholas Lose, MD.  I have reviewed the above documentation for accuracy and completeness, and I agree with the above.

## 2021-07-25 ENCOUNTER — Ambulatory Visit: Payer: Medicare Other | Admitting: Physical Therapy

## 2021-07-25 ENCOUNTER — Encounter: Payer: Self-pay | Admitting: *Deleted

## 2021-07-25 ENCOUNTER — Ambulatory Visit
Admission: RE | Admit: 2021-07-25 | Discharge: 2021-07-25 | Disposition: A | Discharge status: Home / Self Care | Payer: Medicare Other | Source: Ambulatory Visit | Attending: Radiation Oncology | Admitting: Radiation Oncology

## 2021-07-25 ENCOUNTER — Inpatient Hospital Stay: Payer: Medicare Other | Attending: Hematology and Oncology

## 2021-07-25 ENCOUNTER — Other Ambulatory Visit: Payer: Self-pay

## 2021-07-25 ENCOUNTER — Inpatient Hospital Stay (HOSPITAL_BASED_OUTPATIENT_CLINIC_OR_DEPARTMENT_OTHER): Payer: Medicare Other | Admitting: Hematology and Oncology

## 2021-07-25 ENCOUNTER — Other Ambulatory Visit: Payer: Self-pay | Admitting: *Deleted

## 2021-07-25 ENCOUNTER — Ambulatory Visit (HOSPITAL_BASED_OUTPATIENT_CLINIC_OR_DEPARTMENT_OTHER): Payer: Medicare Other | Admitting: Genetic Counselor

## 2021-07-25 DIAGNOSIS — D0512 Intraductal carcinoma in situ of left breast: Secondary | ICD-10-CM

## 2021-07-25 DIAGNOSIS — Z803 Family history of malignant neoplasm of breast: Secondary | ICD-10-CM

## 2021-07-25 DIAGNOSIS — Z8042 Family history of malignant neoplasm of prostate: Secondary | ICD-10-CM | POA: Insufficient documentation

## 2021-07-25 DIAGNOSIS — Z808 Family history of malignant neoplasm of other organs or systems: Secondary | ICD-10-CM | POA: Diagnosis not present

## 2021-07-25 DIAGNOSIS — I10 Essential (primary) hypertension: Secondary | ICD-10-CM | POA: Diagnosis not present

## 2021-07-25 LAB — CMP (CANCER CENTER ONLY)
ALT: 15 U/L (ref 0–44)
AST: 19 U/L (ref 15–41)
Albumin: 3.9 g/dL (ref 3.5–5.0)
Alkaline Phosphatase: 55 U/L (ref 38–126)
Anion gap: 9 (ref 5–15)
BUN: 22 mg/dL (ref 8–23)
CO2: 23 mmol/L (ref 22–32)
Calcium: 8.9 mg/dL (ref 8.9–10.3)
Chloride: 108 mmol/L (ref 98–111)
Creatinine: 1.19 mg/dL — ABNORMAL HIGH (ref 0.44–1.00)
GFR, Estimated: 46 mL/min — ABNORMAL LOW (ref >60)
Glucose, Bld: 105 mg/dL — ABNORMAL HIGH (ref 70–99)
Potassium: 3.8 mmol/L (ref 3.5–5.1)
Sodium: 140 mmol/L (ref 135–145)
Total Bilirubin: 1 mg/dL (ref 0.3–1.2)
Total Protein: 6.4 g/dL — ABNORMAL LOW (ref 6.5–8.1)

## 2021-07-25 LAB — CBC WITH DIFFERENTIAL (CANCER CENTER ONLY)
Abs Immature Granulocytes: 0.02 10*3/uL (ref 0.00–0.07)
Basophils Absolute: 0 10*3/uL (ref 0.0–0.1)
Basophils Relative: 1 %
Eosinophils Absolute: 0.1 10*3/uL (ref 0.0–0.5)
Eosinophils Relative: 2 %
HCT: 39.4 % (ref 36.0–46.0)
Hemoglobin: 13.7 g/dL (ref 12.0–15.0)
Immature Granulocytes: 1 %
Lymphocytes Relative: 17 %
Lymphs Abs: 0.8 10*3/uL (ref 0.7–4.0)
MCH: 30.9 pg (ref 26.0–34.0)
MCHC: 34.8 g/dL (ref 30.0–36.0)
MCV: 88.7 fL (ref 80.0–100.0)
Monocytes Absolute: 0.6 10*3/uL (ref 0.1–1.0)
Monocytes Relative: 14 %
Neutro Abs: 2.8 10*3/uL (ref 1.7–7.7)
Neutrophils Relative %: 65 %
Platelet Count: 166 10*3/uL (ref 150–400)
RBC: 4.44 MIL/uL (ref 3.87–5.11)
RDW: 12.5 % (ref 11.5–15.5)
WBC Count: 4.3 10*3/uL (ref 4.0–10.5)
nRBC: 0 % (ref 0.0–0.2)

## 2021-07-25 LAB — GENETIC SCREENING ORDER

## 2021-07-25 NOTE — Assessment & Plan Note (Signed)
Screening mammogram: indeterminate grouped calcifications in the left breast. Diagnostic mammogram and [place]: 1.5 cm fine linear calcifications in the left breast.  Biopsy revealed high-grade DCIS ER 100%, PR 0%  Pathology review: I discussed with the patient the difference between DCIS and invasive breast cancer. It is considered a precancerous lesion. DCIS is classified as a 0. It is generally detected through mammograms as calcifications. We discussed the significance of grades and its impact on prognosis. We also discussed the importance of ER and PR receptors and their implications to adjuvant treatment options. Prognosis of DCIS dependence on grade, comedo necrosis. It is anticipated that if not treated, 20-30% of DCIS can develop into invasive breast cancer.  Recommendation: 1. Breast conserving surgery 2. +/- adjuvant radiation therapy 3. Followed by antiestrogen therapy with tamoxifen 5 years  Tamoxifen counseling: We discussed the risks and benefits of tamoxifen. These include but not limited to insomnia, hot flashes, mood changes, vaginal dryness, and weight gain. Although rare, serious side effects including endometrial cancer, risk of blood clots were also discussed. We strongly believe that the benefits far outweigh the risks. Patient understands these risks and consented to starting treatment. Planned treatment duration is 5 years.  Return to clinic after surgery to discuss the final pathology report and come up with an adjuvant treatment plan.

## 2021-07-25 NOTE — Progress Notes (Signed)
Radiation Oncology         ([phone]) [phone] ________________________________  Name: Kylie Taylor        MRN: [id number]  Date of Service: 07/25/2021 DOB: 1940/10/05  HU:[id number], Kylie Salina, MD  Jovita Kussmaul, MD     REFERRING PHYSICIAN: Jovita Kussmaul, MD   DIAGNOSIS: The encounter diagnosis was Ductal carcinoma in situ (DCIS) of left breast.   HISTORY OF PRESENT ILLNESS: Kylie Taylor is a 80 y.o. female seen in the multidisciplinary breast clinic for a new diagnosis of left breast cancer. The patient was noted to have screening detected calcifications in the left breast and these were noted between 12:00- 1:00 position. Diagnostic imaging measured these spanning 1.5 cm and a stereotactic biopsy revealed a high grade DCIS that was ER positive, PR negative. She's seen today to discuss treatment recommendations of her cancer.    PREVIOUS RADIATION THERAPY: No   PAST MEDICAL HISTORY:  Past Medical History:  Diagnosis Date   Allergy    seasonal   Angiomyolipoma    Anxiety    Asthma    Benign brain tumor ([place])    x3   Benign positional vertigo    Bilateral bunions    and pronated feet   Carpal tunnel syndrome    Cataract    DDD (degenerative disc disease)    Diverticulosis    2007 colon   Dysphagia    Food impaction of esophagus    multiple   GERD (gastroesophageal reflux disease)    Hiatal hernia    Hx of adenomatous polyp of colon 01/03/2016   Hyperlipemia    Hypertension    Hypothyroidism    Lumbar radiculopathy    recurrent since 1996   Multiple gastric polyps    Osteopenia    Rosacea    Small vessel disease, cerebrovascular    dx in 2012   Tick bites    2017- taking Doxycycline for this       PAST SURGICAL HISTORY: Past Surgical History:  Procedure Laterality Date   COLONOSCOPY     DILATION AND CURETTAGE OF UTERUS     MOUTH SURGERY     PARTIAL HYSTERECTOMY     for fibroids   UPPER GASTROINTESTINAL ENDOSCOPY       FAMILY HISTORY:  Family History   Problem Relation Age of Onset   Breast cancer Unknown        aunt   Prostate cancer Unknown        grandfather/uncle   Barrett's esophagus Father    Heart attack Father        died with this age 55   Migraines Father    Hypertension Father    Peptic Ulcer Disease Father    Alzheimer's disease Mother        died at age 59 with this and pneumonia   Anemia Mother    Hypothyroidism Mother    Hypertension Mother    Polymyalgia rheumatica Mother    Migraines Sister    Hypothyroidism Sister    Hypertension Sister    Sjogren's syndrome Sister    Other Sister        cerebral hemorrhage and sepsis, died with these causes at 4   Migraines Brother    Other Brother        prostate disease and degenerative spine disorder   Brain cancer Maternal Grandmother    Other Son        degenerative disease of the lumbar spine   Brain cancer Paternal  Aunt    Brain cancer Paternal Uncle    Colon cancer Neg Hx    Colon polyps Neg Hx    Rectal cancer Neg Hx    Stomach cancer Neg Hx    Esophageal cancer Neg Hx      SOCIAL HISTORY:  reports that she has never smoked. She has never used smokeless tobacco. She reports current alcohol use. She reports that she does not use drugs. The patient is divorced and lives in [place]. She is accompanied by her friend Kylie Taylor. 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    ALLERGIES: Atorvastatin calcium, Coffee bean extract [coffea arabica], Demerol [meperidine hcl], Metronidazole, Tobacco [tobacco], and Triamterene   MEDICATIONS:  Current Outpatient Medications  Medication Sig Dispense Refill   albuterol (PROVENTIL HFA;VENTOLIN HFA) 108 (90 Base) MCG/ACT inhaler Inhale 2 puffs into the lungs every 6 (six) hours as needed for wheezing or shortness of breath. Reported on 12/11/2015     aMILoride (MIDAMOR) 5 MG tablet Take 5 mg by mouth daily.     BIOTIN  PO Take 1 capsule by mouth daily.     Calcium Citrate-Vitamin D (CITRACAL + D PO) Take 1 tablet by mouth daily.      levothyroxine (SYNTHROID) 100 MCG tablet Take 100 mcg by mouth daily before breakfast.     losartan (COZAAR) 100 MG tablet Take 100 mg by mouth daily.     meloxicam (MOBIC) 7.5 MG tablet Take 7.5 mg by mouth as needed for pain.      omeprazole (PRILOSEC) 20 MG capsule Take 20 mg by mouth daily.     simvastatin (ZOCOR) 40 MG tablet Take 40 mg by mouth daily at 6 PM.     VITAMIN D, CHOLECALCIFEROL, PO Take 2,000 Units by mouth daily.     No current facility-administered medications for this encounter.     REVIEW OF SYSTEMS: On review of systems, the patient reports that she is doing pretty well since her biopsy. She reports she has several chronic conditions and symptoms, but no new onset of concerns related to her breast.   PHYSICAL EXAM:  Wt Readings from Last 3 Encounters:  10/16/20 178 lb (80.7 kg)  05/14/18 183 lb 6.4 oz (83.2 kg)  03/27/18 180 lb 6.4 oz (81.8 kg)   Temp Readings from Last 3 Encounters:  03/27/18 98.6 F (37 C) (Oral)  12/28/15 (!) 96.5 F (35.8 C)  09/11/15 98 F (36.7 C) (Oral)   BP Readings from Last 3 Encounters:  05/14/18 120/78  03/27/18 (!) 161/97  12/28/15 (!) 93/57   Pulse Readings from Last 3 Encounters:  05/14/18 80  03/27/18 78  12/28/15 (!) 59    In general this is a well appearing caucasian female in no acute distress. She's alert and oriented x4 and appropriate throughout the examination. Cardiopulmonary assessment is negative for acute distress and she exhibits normal effort. Bilateral breast exam is deferred.    ECOG = 0  0 - Asymptomatic (Fully active, able to carry on all predisease activities without restriction)  1 - Symptomatic but completely ambulatory (Restricted in physically strenuous activity but ambulatory and able to carry out work of a light or sedentary nature. For example, light housework, office  work)  2 - Symptomatic, <50% in bed during the day (Ambulatory and capable of all self care but unable to carry out any work activities. Up and about  more than 50% of waking hours)  3 - Symptomatic, >50% in bed, but not bedbound (Capable of only limited self-care, confined to bed or chair 50% or more of waking hours)  4 - Bedbound (Completely disabled. Cannot carry on any self-care. Totally confined to bed or chair)  5 - Death   Eustace Pen MM, Creech RH, Tormey DC, et al. [phone]). "Toxicity and response criteria of the [place] [facility]". [place] Oncol. 5 (6): 649-55    LABORATORY DATA:  Lab Results  Component Value Date   WBC 5.4 03/27/2018   HGB 14.7 03/27/2018   HCT 42.7 03/27/2018   MCV 90.3 03/27/2018   PLT 151 03/27/2018   Lab Results  Component Value Date   NA 139 03/27/2018   K 4.0 03/27/2018   CL 109 03/27/2018   CO2 22 03/27/2018   Lab Results  Component Value Date   ALT 23 03/27/2018   AST 25 03/27/2018   ALKPHOS 75 03/27/2018   BILITOT 0.4 03/27/2018      RADIOGRAPHY: No results found.     IMPRESSION/PLAN: 1. High Grade, ER positive DCIS of the left breast. Dr. Lisbeth Renshaw discusses the pathology findings and reviews the nature of early left breast disease. The consensus from the breast conference includes breast conservation with lumpectomy of the left breast. We discussed the rationale for external radiotherapy to the breast  to reduce risks of local recurrence followed by antiestrogen therapy. We also discussed the possible ability to forgo radiotherapy based on final results of surgery for more favorable cases. We discussed the risks, benefits, short, and long term effects of radiotherapy, as well as the curative intent, and the patient is interested in proceeding but may want to have treatment in [facility] Dr. Lisbeth Renshaw discusses the delivery and logistics of radiotherapy and anticipates a course of 4 weeks of radiotherapy with deep inspiration  breath hold technique, and would be happy to see her back if she desires treatment in [place].   In a visit lasting 60 minutes, greater than 50% of the time was spent face to face reviewing her case, as well as in preparation of, discussing, and coordinating the patient's care.  The above documentation reflects my direct findings during this shared patient visit. Please see the separate note by Dr. Lisbeth Renshaw on this date for the remainder of the patient's plan of care.    Carola Rhine, [facility]    **Disclaimer: This note was dictated with voice recognition software. Similar sounding words can inadvertently be transcribed and this note may contain transcription errors which may not have been corrected upon publication of note.**

## 2021-07-26 ENCOUNTER — Encounter: Payer: Self-pay | Admitting: Genetic Counselor

## 2021-07-26 DIAGNOSIS — Z808 Family history of malignant neoplasm of other organs or systems: Secondary | ICD-10-CM | POA: Insufficient documentation

## 2021-07-26 DIAGNOSIS — Z8042 Family history of malignant neoplasm of prostate: Secondary | ICD-10-CM | POA: Insufficient documentation

## 2021-07-26 NOTE — Progress Notes (Signed)
REFERRING PROVIDER: Nicholas Lose, MD [place], [place] [address]  PRIMARY PROVIDER:  Derinda Late, MD  PRIMARY REASON FOR VISIT:  1. Ductal carcinoma in situ (DCIS) of left breast   2. Family history of prostate cancer   3. Family history of brain cancer     HISTORY OF PRESENT ILLNESS:   Ms. Colon, a 80 y.o. female, was seen for a [facility] cancer genetics consultation during the breast multidisciplinary clinic at the request of Dr. Lindi Adie due to a personal and family history of cancer.  Ms. Smits presents to clinic today to discuss the possibility of a hereditary predisposition to cancer, to discuss genetic testing, and to further clarify her future cancer risks, as well as potential cancer risks for family members.   In 2022, at the age of 86, Ms. 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  CANCER HISTORY:  Oncology History  Ductal carcinoma in situ (DCIS) of left breast  07/19/2021 Initial Diagnosis   Screening mammogram: indeterminate grouped calcifications in the left breast. Diagnostic mammogram and [place]: 1.5 cm fine linear calcifications in the left breast.  Biopsy revealed high-grade DCIS ER 100%, PR 0%   07/25/2021 Cancer Staging   Staging form: Breast, AJCC 8th Edition - Clinical stage from 07/25/2021: Stage 0 (cTis (DCIS), cN0, cM0, G3, ER+, PR-) - Signed by Nicholas Lose, MD on 07/25/2021 Stage prefix: Initial diagnosis Nuclear grade: G3 Histologic grading system: 3 grade system      RISK FACTORS:  Menarche was at age 75.  First live birth at age 38.  OCP use for approximately 0 years.  Uterus intact: no.  Menopausal status: postmenopausal.  HRT use: 10-15 years. Colonoscopy: yes; 2020 Mammogram within the last year: yes Any excessive radiation exposure in the past: no  Past Medical History:  Diagnosis Date   Allergy    seasonal   Angiomyolipoma    Anxiety    Asthma    Benign brain tumor ([place])     x3   Benign positional vertigo    Bilateral bunions    and pronated feet   Carpal tunnel syndrome    Cataract    DDD (degenerative disc disease)    Diverticulosis    2007 colon   Dysphagia    Food impaction of esophagus    multiple   GERD (gastroesophageal reflux disease)    Hiatal hernia    Hx of adenomatous polyp of colon 01/03/2016   Hyperlipemia    Hypertension    Hypothyroidism    Lumbar radiculopathy    recurrent since 1996   Multiple gastric polyps    Osteopenia    Rosacea    Small vessel disease, cerebrovascular    dx in 2012   Tick bites    2017- taking Doxycycline for this    Past Surgical History:  Procedure Laterality Date   COLONOSCOPY     DILATION AND CURETTAGE OF UTERUS     MOUTH SURGERY     PARTIAL HYSTERECTOMY     for fibroids   UPPER GASTROINTESTINAL ENDOSCOPY      Social History   Socioeconomic History   Marital status: Divorced    Spouse name: Not on file   Number of children: Not on file   Years of education: Not on file   Highest education level: Not on file  Occupational History   Occupation: retired  Tobacco Use   Smoking status: Never   Smokeless tobacco: Never  Vaping Use   Vaping Use: Never used  Substance and Sexual Activity   Alcohol use: Yes    Alcohol/week: 0.0 standard drinks    Comment: occasional   Drug use: No   Sexual activity: Not on file  Other Topics Concern   Not on file  Social History Narrative   She is retired.  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   She is divorced and lives alone in the same how she grew up in.  Active in church.   Enjoys a Medical laboratory scientific officer in [place].   Never smoker.   1-2 drinks per month.  No substance use.   Social Determinants of Health   Financial Resource Strain: Not on file  Food Insecurity: Not on file  Transportation Needs: Not on file  Physical Activity: Not on file  Stress: Not on file  Social Connections: Not on file     FAMILY HISTORY:  We  obtained a detailed, 4-generation family history.  Significant diagnoses are listed below: Family History  Problem Relation Age of Onset   Prostate cancer Brother 77   Brain cancer Paternal Aunt    Breast cancer Paternal Aunt 29   Thyroid cancer Paternal 35    Brain cancer Paternal Uncle 43   Prostate cancer Paternal Uncle 71   Brain cancer Maternal Grandmother 70   Prostate cancer Paternal Grandfather    Pancreatic cancer Cousin      Ms. Hailu's brother was diagnosed with prostate cancer at 100. Her maternal grandmother was diagnosed with brain cancer at 72. Her paternal aunt was diagnosed with thyroid and breast cancer that metastasized to the brain at 41. Her paternal uncle was diagnosed with brain cancer at 27 and this uncle's son was diagnosed with pancreatic cancer. Another paternal uncle was diagnosed with prostate cancer at 83. Her paternal grandfather was diagnosed with prostate cancer in his 52s and died at 12.  Ms. Laprise is unaware of previous family history of genetic testing for hereditary cancer risks. There no reported Ashkenazi Jewish ancestry.   GENETIC COUNSELING ASSESSMENT: Ms. Dempsey is a 80 y.o. female with a personal and family history of cancer which is somewhat suggestive of a hereditary cancer syndrome and predisposition to cancer given multiple individuals with breast, prostate, and pancreatic cancer. We, therefore, discussed and recommended the following at today's visit.   DISCUSSION: We discussed that 5 - 10% of cancer is hereditary, with most cases of hereditary breast cancer associated with mutations in BRCA1/2.  There are other genes that can be associated with hereditary breast, prostate, and pancreatic cancer syndromes. Type of cancer risk and level of risk are gene-specific. We discussed that testing is beneficial for several reasons including knowing how to follow individuals after completing their treatment, identifying whether potential treatment options  would be beneficial, and understanding if other family members could be at risk for cancer and allowing them to undergo genetic testing.   We reviewed the characteristics, features and inheritance patterns of hereditary cancer syndromes. We also discussed genetic testing, including the appropriate family members to test, the process of testing, insurance coverage and turn-around-time for results. We discussed the implications of a negative, positive and/or variant of uncertain significant result. In order to get genetic test results in a timely manner so that Ms. Hitchman can use these genetic test results for surgical decisions, we recommended Ms. Butters pursue genetic testing for the BRCAplus. Once complete, we recommend Ms. Leclaire pursue reflex genetic testing to a  more comprehensive gene panel.   Ms. Hor  was offered a common hereditary cancer panel (47 genes) and an expanded pan-cancer panel (77 genes). Ms. Hollopeter was informed of the benefits and limitations of each panel, including that expanded pan-cancer panels contain genes that do not have clear management guidelines at this point in time.  We also discussed that as the number of genes included on a panel increases, the chances of variants of uncertain significance increases.  After considering the benefits and limitations of each gene panel, Ms. Slates  elected to have [facility] CancerNext-Expanded Panel (77 genes).  The CancerNext-Expanded gene panel offered by [facility] and includes sequencing, rearrangement, and RNA analysis for the following 77 genes: AIP, ALK, APC, ATM, AXIN2, BAP1, BARD1, BLM, BMPR1A, BRCA1, BRCA2, BRIP1, CDC73, CDH1, CDK4, CDKN1B, CDKN2A, CHEK2, CTNNA1, DICER1, FANCC, FH, FLCN, GALNT12, KIF1B, LZTR1, MAX, MEN1, MET, MLH1, MSH2, MSH3, MSH6, MUTYH, NBN, NF1, NF2, NTHL1, PALB2, PHOX2B, PMS2, POT1, PRKAR1A, PTCH1, PTEN, RAD51C, RAD51D, RB1, RECQL, RET, SDHA, SDHAF2, SDHB, SDHC, SDHD, SMAD4, SMARCA4, SMARCB1, SMARCE1, STK11,  SUFU, TMEM127, TP53, TSC1, TSC2, VHL and XRCC2 (sequencing and deletion/duplication); EGFR, EGLN1, HOXB13, KIT, MITF, PDGFRA, POLD1, and POLE (sequencing only); EPCAM and GREM1 (deletion/duplication only).   Based on Ms. Mira's personal and family history of cancer, she meets medical criteria for genetic testing. Despite that she meets criteria, she may still have an out of pocket cost. We discussed that if her out of pocket cost for testing is over $100, the laboratory should contact them to discuss self-pay prices, patient pay assistance programs, if applicable, and other billing options.   PLAN: After considering the risks, benefits, and limitations, Ms. Lundin provided informed consent to pursue genetic testing and the blood sample was sent to [facility] for analysis of the BRCAplus and CancerNext-Expanded Panel (77 genes). Results should be available within approximately 1-2 weeks' time, at which point they will be disclosed by telephone to Ms. Kaas, as will any additional recommendations warranted by these results. Ms. Inman will receive a summary of her genetic counseling visit and a copy of her results once available. This information will also be available in Epic.   Ms. Defranco questions were answered to her satisfaction today. Our contact information was provided should additional questions or concerns arise. Thank you for the referral and allowing [place] to share in the care of your patient.   Lucille Passy, MS, [facility] Genetic Counselor [email or web address] (P) [phone]  The patient was seen for a total of 20 minutes in face-to-face genetic counseling.  The patient brought her friend. Drs. Magrinat, Lindi Adie and/or Burr Medico were available to discuss this case as needed.  _______________________________________________________________________ For Office Staff:  Number of people involved in session: 2 Was an Intern/ student involved with case: no

## 2021-07-27 ENCOUNTER — Ambulatory Visit: Payer: Self-pay | Admitting: General Surgery

## 2021-07-27 DIAGNOSIS — D0512 Intraductal carcinoma in situ of left breast: Secondary | ICD-10-CM

## 2021-07-31 ENCOUNTER — Telehealth: Payer: Self-pay | Admitting: Hematology and Oncology

## 2021-07-31 ENCOUNTER — Other Ambulatory Visit: Payer: Self-pay | Admitting: *Deleted

## 2021-07-31 DIAGNOSIS — D0512 Intraductal carcinoma in situ of left breast: Secondary | ICD-10-CM

## 2021-07-31 NOTE — Telephone Encounter (Signed)
Scheduled per sch msg. Called and left msg  

## 2021-08-01 ENCOUNTER — Encounter: Payer: Self-pay | Admitting: General Practice

## 2021-08-01 ENCOUNTER — Telehealth: Payer: Self-pay | Admitting: Radiation Oncology

## 2021-08-01 NOTE — Telephone Encounter (Signed)
Called patient to schedule consultation with Dr. Moody. No answer, LVM for return call. 

## 2021-08-01 NOTE — Progress Notes (Signed)
[facility] Psychosocial Distress Screening Spiritual Care  Met with Adrienne by phone following Breast Multidisciplinary Clinic to introduce [place] team/resources, reviewing distress screen per protocol.  The patient scored a 1 on the Psychosocial Distress Thermometer which indicates mild distress. Also assessed for distress and other psychosocial needs.   ONCBCN DISTRESS SCREENING 08/01/2021  Screening Type Initial Screening  Distress experienced in past week (1-10) 1  Practical problem type Transportation  Information Concerns Type Lack of info about complementary therapy choices;Lack of info about treatment;Lack of info about diagnosis  Referral to support programs Yes   Ms Brackens was very outgoing and friendly on the phone, calling [facility] "medical speed dating," which made [place] laugh. [place] and [place]. She reports good support from friends and church. Transportation was just a concern to organize for after surgery; ordinarily she drives herself.   Follow up needed: No. Per Ms Kallen, no other needs at this time, but she plans to reach out if needs arise or circumstances change.   [place], [place], [facility] Pager [phone] Voicemail [phone]

## 2021-08-02 ENCOUNTER — Encounter: Payer: Self-pay | Admitting: *Deleted

## 2021-08-02 ENCOUNTER — Telehealth: Payer: Self-pay | Admitting: *Deleted

## 2021-08-02 NOTE — Telephone Encounter (Signed)
Left message to follow up from [facility] 11/2 and assess navigation needs.

## 2021-08-06 ENCOUNTER — Telehealth: Payer: Self-pay | Admitting: *Deleted

## 2021-08-06 ENCOUNTER — Encounter: Payer: Self-pay | Admitting: *Deleted

## 2021-08-06 NOTE — Telephone Encounter (Signed)
Received call from patient with some questions regarding surgery and appointments.  Reviewed with patient. Patient verbalized understanding.  Encouraged her to call should anything else arise.

## 2021-08-07 ENCOUNTER — Telehealth: Payer: Self-pay | Admitting: *Deleted

## 2021-08-07 NOTE — Telephone Encounter (Signed)
Received call from patient with some additional questions she had after her reading her breast cancer notebook.  Discussed these with her. She knows to call should she have any more questions.

## 2021-08-10 ENCOUNTER — Other Ambulatory Visit: Payer: Self-pay | Admitting: Radiation Oncology

## 2021-08-10 ENCOUNTER — Inpatient Hospital Stay
Admission: RE | Admit: 2021-08-10 | Discharge: 2021-08-10 | Disposition: A | Discharge status: Home / Self Care | Payer: Self-pay | Source: Ambulatory Visit | Attending: Radiation Oncology | Admitting: Radiation Oncology

## 2021-08-10 DIAGNOSIS — D0512 Intraductal carcinoma in situ of left breast: Secondary | ICD-10-CM

## 2021-08-13 ENCOUNTER — Telehealth: Payer: Self-pay | Admitting: Genetic Counselor

## 2021-08-13 ENCOUNTER — Encounter: Payer: Self-pay | Admitting: Genetic Counselor

## 2021-08-13 DIAGNOSIS — Z1379 Encounter for other screening for genetic and chromosomal anomalies: Secondary | ICD-10-CM | POA: Insufficient documentation

## 2021-08-13 NOTE — Telephone Encounter (Signed)
I contacted Ms. Elden to discuss her genetic testing results. No pathogenic variants were identified in the 77 genes analyzed.   The test report has been scanned into EPIC and is located under the Molecular Pathology section of the Results Review tab.  A portion of the result report is included below for reference. Detailed clinic note to follow.  Lucille Passy, MS, [facility] Genetic Counselor [email or web address] (P) [phone]

## 2021-08-15 ENCOUNTER — Encounter (HOSPITAL_BASED_OUTPATIENT_CLINIC_OR_DEPARTMENT_OTHER): Payer: Self-pay | Admitting: General Surgery

## 2021-08-17 ENCOUNTER — Ambulatory Visit: Payer: Self-pay | Admitting: Genetic Counselor

## 2021-08-17 DIAGNOSIS — D0512 Intraductal carcinoma in situ of left breast: Secondary | ICD-10-CM

## 2021-08-17 NOTE — Progress Notes (Signed)
HPI:   Kylie Taylor was previously seen in the [place] clinic due to a personal and family history of cancer and concerns regarding a hereditary predisposition to cancer. Please refer to our prior cancer genetics clinic note for more information regarding our discussion, assessment and recommendations, at the time. Kylie Taylor recent genetic test results were disclosed to her, as were recommendations warranted by these results. These results and recommendations are discussed in more detail below.  CANCER HISTORY:  Oncology History  Ductal carcinoma in situ (DCIS) of left breast  07/19/2021 Initial Diagnosis   Screening mammogram: indeterminate grouped calcifications in the left breast. Diagnostic mammogram and [place]: 1.5 cm fine linear calcifications in the left breast.  Biopsy revealed high-grade DCIS ER 100%, PR 0%   07/25/2021 Cancer Staging   Staging form: Breast, AJCC 8th Edition - Clinical stage from 07/25/2021: Stage 0 (cTis (DCIS), cN0, cM0, G3, ER+, PR-) - Signed by Nicholas Lose, MD on 07/25/2021 Stage prefix: Initial diagnosis Nuclear grade: G3 Histologic grading system: 3 grade system     Genetic Testing   [facility] CancerNext-Expanded Panel was Negative. Report date is 08/07/2021.  The CancerNext-Expanded gene panel offered by [facility] and includes sequencing, rearrangement, and RNA analysis for the following 77 genes: AIP, ALK, APC, ATM, AXIN2, BAP1, BARD1, BLM, BMPR1A, BRCA1, BRCA2, BRIP1, CDC73, CDH1, CDK4, CDKN1B, CDKN2A, CHEK2, CTNNA1, DICER1, FANCC, FH, FLCN, GALNT12, KIF1B, LZTR1, MAX, MEN1, MET, MLH1, MSH2, MSH3, MSH6, MUTYH, NBN, NF1, NF2, NTHL1, PALB2, PHOX2B, PMS2, POT1, PRKAR1A, PTCH1, PTEN, RAD51C, RAD51D, RB1, RECQL, RET, SDHA, SDHAF2, SDHB, SDHC, SDHD, SMAD4, SMARCA4, SMARCB1, SMARCE1, STK11, SUFU, TMEM127, TP53, TSC1, TSC2, VHL and XRCC2 (sequencing and deletion/duplication); EGFR, EGLN1, HOXB13, KIT, MITF, PDGFRA, POLD1, and POLE (sequencing only);  EPCAM and GREM1 (deletion/duplication only).      FAMILY HISTORY:  We obtained a detailed, 4-generation family history.  Significant diagnoses are listed below:      Family History  Problem Relation Age of Onset   Prostate cancer Brother 32   Brain cancer Paternal Aunt     Breast cancer Paternal Aunt 81   Thyroid cancer Paternal 53     Brain cancer Paternal Uncle 43   Prostate cancer Paternal Uncle 37   Brain cancer Maternal Grandmother 70   Prostate cancer Paternal Grandfather     Pancreatic cancer Cousin        Ms. Albarracin's brother was diagnosed with prostate cancer at 10. Her maternal grandmother was diagnosed with brain cancer at 34. Her paternal aunt was diagnosed with thyroid and breast cancer that metastasized to the brain at 40. Her paternal uncle was diagnosed with brain cancer at 90 and this uncle's son was diagnosed with pancreatic cancer. Another paternal uncle was diagnosed with prostate cancer at 69. Her paternal grandfather was diagnosed with prostate cancer in his 50s and died at 43.   Ms. Gaba is unaware of previous family history of genetic testing for hereditary cancer risks. There no reported Ashkenazi Jewish ancestry.     GENETIC TEST RESULTS:  The [facility] CancerNext-Expanded Panel found no pathogenic mutations.   The CancerNext-Expanded gene panel offered by [facility] and includes sequencing, rearrangement, and RNA analysis for the following 77 genes: AIP, ALK, APC, ATM, AXIN2, BAP1, BARD1, BLM, BMPR1A, BRCA1, BRCA2, BRIP1, CDC73, CDH1, CDK4, CDKN1B, CDKN2A, CHEK2, CTNNA1, DICER1, FANCC, FH, FLCN, GALNT12, KIF1B, LZTR1, MAX, MEN1, MET, MLH1, MSH2, MSH3, MSH6, MUTYH, NBN, NF1, NF2, NTHL1, PALB2, PHOX2B, PMS2, POT1, PRKAR1A, PTCH1, PTEN, RAD51C, RAD51D, RB1,  RECQL, RET, SDHA, SDHAF2, SDHB, SDHC, SDHD, SMAD4, SMARCA4, SMARCB1, SMARCE1, STK11, SUFU, TMEM127, TP53, TSC1, TSC2, VHL and XRCC2 (sequencing and deletion/duplication); EGFR, EGLN1, HOXB13, KIT, MITF,  PDGFRA, POLD1, and POLE (sequencing only); EPCAM and GREM1 (deletion/duplication only).     The test report has been scanned into EPIC and is located under the Molecular Pathology section of the Results Review tab.  A portion of the result report is included below for reference. Genetic testing reported out on 08/07/2021.         Even though a pathogenic variant was not identified, possible explanations for the cancer in the family may include: There may be no hereditary risk for cancer in the family. The cancers in Ms. Obarr and/or her family may be due to other genetic or environmental factors. There may be a gene mutation in one of these genes that current testing methods cannot detect, but that chance is small. There could be another gene that has not yet been discovered, or that we have not yet tested, that is responsible for the cancer diagnoses in the family.  It is also possible there is a hereditary cause for the cancer in the family that Ms. Kulpa did not inherit.  Therefore, it is important to remain in touch with cancer genetics in the future so that we can continue to offer Ms. Inghram the most up to date genetic testing.   ADDITIONAL GENETIC TESTING:  We discussed with Ms. Antu that her genetic testing was fairly extensive.  If there are genes identified to increase cancer risk that can be analyzed in the future, we would be happy to discuss and coordinate this testing at that time.     CANCER SCREENING RECOMMENDATIONS:  Ms. Collyer test result is considered negative (normal).  This means that we have not identified a hereditary cause for her personal and family history of cancer at this time.   An individual's cancer risk and medical management are not determined by genetic test results alone. Overall cancer risk assessment incorporates additional factors, including personal medical history, family history, and any available genetic information that may result in a  personalized plan for cancer prevention and surveillance. Therefore, it is recommended she continue to follow the cancer management and screening guidelines provided by her oncology and primary healthcare provider.  RECOMMENDATIONS FOR FAMILY MEMBERS:   Since she did not inherit a mutation in a cancer predisposition gene included on this panel, her son could not have inherited a mutation from her in one of these genes. Other members of the family may still carry a pathogenic variant in one of these genes that Ms. Malta did not inherit. Based on the family history, we recommend her brother have genetic counseling and testing. Ms. Heward will let us know if we can be of any assistance in coordinating genetic counseling and/or testing for this family member.    FOLLOW-UP:  Cancer genetics is a rapidly advancing field and it is possible that new genetic tests will be appropriate for her and/or her family members in the future. We encouraged her to remain in contact with cancer genetics on an annual basis so we can update her personal and family histories and let her know of advances in cancer genetics that may benefit this family.   Our contact number was provided. Ms. Kreutzer questions were answered to her satisfaction, and she knows she is welcome to call us at anytime with additional questions or concerns.   Lucille Passy, MS, [facility] Genetic  Counselor [email or web address] (P) [phone]

## 2021-08-22 MED ORDER — CHLORHEXIDINE GLUCONATE CLOTH 2 % EX PADS: 6.0000 | MEDICATED_PAD | Freq: Once | CUTANEOUS | Status: DC

## 2021-08-22 NOTE — Progress Notes (Signed)

## 2021-08-23 ENCOUNTER — Other Ambulatory Visit: Payer: Self-pay

## 2021-08-23 ENCOUNTER — Ambulatory Visit (HOSPITAL_BASED_OUTPATIENT_CLINIC_OR_DEPARTMENT_OTHER): Payer: Medicare Other | Admitting: Anesthesiology

## 2021-08-23 ENCOUNTER — Encounter (HOSPITAL_BASED_OUTPATIENT_CLINIC_OR_DEPARTMENT_OTHER)
Admission: RE | Disposition: A | Discharge status: Home / Self Care | Payer: Self-pay | Source: Home / Self Care | Attending: General Surgery

## 2021-08-23 ENCOUNTER — Ambulatory Visit (HOSPITAL_BASED_OUTPATIENT_CLINIC_OR_DEPARTMENT_OTHER)
Admission: RE | Admit: 2021-08-23 | Discharge: 2021-08-23 | Disposition: A | Discharge status: Home / Self Care | Payer: Medicare Other | Attending: General Surgery | Admitting: General Surgery

## 2021-08-23 ENCOUNTER — Encounter (HOSPITAL_BASED_OUTPATIENT_CLINIC_OR_DEPARTMENT_OTHER): Payer: Self-pay | Admitting: General Surgery

## 2021-08-23 DIAGNOSIS — D0512 Intraductal carcinoma in situ of left breast: Secondary | ICD-10-CM | POA: Diagnosis not present

## 2021-08-23 DIAGNOSIS — J45909 Unspecified asthma, uncomplicated: Secondary | ICD-10-CM | POA: Insufficient documentation

## 2021-08-23 DIAGNOSIS — I10 Essential (primary) hypertension: Secondary | ICD-10-CM | POA: Diagnosis not present

## 2021-08-23 HISTORY — PX: BREAST LUMPECTOMY WITH RADIOACTIVE SEED LOCALIZATION: SHX6424

## 2021-08-23 SURGERY — BREAST LUMPECTOMY WITH RADIOACTIVE SEED LOCALIZATION
Anesthesia: General | Site: Breast | Laterality: Left

## 2021-08-23 MED ORDER — LACTATED RINGERS IV SOLN: INTRAVENOUS | Status: DC

## 2021-08-23 MED ORDER — GABAPENTIN 100 MG PO CAPS
ORAL_CAPSULE | ORAL | Status: AC
  Filled 2021-08-23: qty 1

## 2021-08-23 MED ORDER — LIDOCAINE 2% (20 MG/ML) 5 ML SYRINGE
INTRAMUSCULAR | Status: AC
  Filled 2021-08-23: qty 5

## 2021-08-23 MED ORDER — ONDANSETRON HCL 4 MG/2ML IJ SOLN: 4.0000 mg | Freq: Once | INTRAMUSCULAR | Status: DC | PRN

## 2021-08-23 MED ORDER — DEXAMETHASONE SODIUM PHOSPHATE 4 MG/ML IJ SOLN
INTRAMUSCULAR | Status: DC | PRN
  Administered 2021-08-23: 5 mg via INTRAVENOUS

## 2021-08-23 MED ORDER — OXYCODONE HCL 5 MG PO TABS: 5.0000 mg | ORAL_TABLET | Freq: Once | ORAL | Status: DC | PRN

## 2021-08-23 MED ORDER — ONDANSETRON HCL 4 MG/2ML IJ SOLN
INTRAMUSCULAR | Status: AC
  Filled 2021-08-23: qty 2

## 2021-08-23 MED ORDER — OXYCODONE HCL 5 MG/5ML PO SOLN: 5.0000 mg | Freq: Once | ORAL | Status: DC | PRN

## 2021-08-23 MED ORDER — CEFAZOLIN SODIUM-DEXTROSE 2-4 GM/100ML-% IV SOLN
INTRAVENOUS | Status: AC
  Filled 2021-08-23: qty 100

## 2021-08-23 MED ORDER — FENTANYL CITRATE (PF) 100 MCG/2ML IJ SOLN
INTRAMUSCULAR | Status: AC
  Filled 2021-08-23: qty 2

## 2021-08-23 MED ORDER — EPHEDRINE SULFATE 50 MG/ML IJ SOLN
INTRAMUSCULAR | Status: DC | PRN
  Administered 2021-08-23: 10 mg via INTRAVENOUS

## 2021-08-23 MED ORDER — ONDANSETRON HCL 4 MG/2ML IJ SOLN
INTRAMUSCULAR | Status: DC | PRN
  Administered 2021-08-23: 4 mg via INTRAVENOUS

## 2021-08-23 MED ORDER — AMISULPRIDE (ANTIEMETIC) 5 MG/2ML IV SOLN: 10.0000 mg | Freq: Once | INTRAVENOUS | Status: DC | PRN

## 2021-08-23 MED ORDER — GABAPENTIN 100 MG PO CAPS
100.0000 mg | ORAL_CAPSULE | ORAL | Status: AC
  Administered 2021-08-23: 100 mg via ORAL

## 2021-08-23 MED ORDER — BUPIVACAINE-EPINEPHRINE (PF) 0.25% -1:200000 IJ SOLN
INTRAMUSCULAR | Status: DC | PRN
  Administered 2021-08-23: 20 mL

## 2021-08-23 MED ORDER — ACETAMINOPHEN 500 MG PO TABS
ORAL_TABLET | ORAL | Status: AC
  Filled 2021-08-23: qty 2

## 2021-08-23 MED ORDER — PROPOFOL 10 MG/ML IV BOLUS
INTRAVENOUS | Status: DC | PRN
  Administered 2021-08-23: 110 mg via INTRAVENOUS

## 2021-08-23 MED ORDER — CELECOXIB 100 MG PO CAPS
100.0000 mg | ORAL_CAPSULE | ORAL | Status: AC
  Administered 2021-08-23: 100 mg via ORAL

## 2021-08-23 MED ORDER — LIDOCAINE HCL 1 % IJ SOLN
INTRAMUSCULAR | Status: DC | PRN
  Administered 2021-08-23: 60 mg via INTRADERMAL

## 2021-08-23 MED ORDER — FENTANYL CITRATE (PF) 100 MCG/2ML IJ SOLN: 25.0000 ug | INTRAMUSCULAR | Status: DC | PRN

## 2021-08-23 MED ORDER — CEFAZOLIN SODIUM-DEXTROSE 2-4 GM/100ML-% IV SOLN
2.0000 g | INTRAVENOUS | Status: AC
  Administered 2021-08-23: 2 g via INTRAVENOUS

## 2021-08-23 MED ORDER — FENTANYL CITRATE (PF) 100 MCG/2ML IJ SOLN
INTRAMUSCULAR | Status: DC | PRN
  Administered 2021-08-23: 50 ug via INTRAVENOUS
  Administered 2021-08-23: 25 ug via INTRAVENOUS

## 2021-08-23 MED ORDER — ACETAMINOPHEN 500 MG PO TABS
1000.0000 mg | ORAL_TABLET | ORAL | Status: AC
  Administered 2021-08-23: 1000 mg via ORAL

## 2021-08-23 MED ORDER — HYDROCODONE-ACETAMINOPHEN 5-325 MG PO TABS: 1.0000 | ORAL_TABLET | Freq: Four times a day (QID) | ORAL | 0 refills | Status: DC | PRN

## 2021-08-23 MED ORDER — CELECOXIB 100 MG PO CAPS
ORAL_CAPSULE | ORAL | Status: AC
  Filled 2021-08-23: qty 1

## 2021-08-23 MED ORDER — DEXAMETHASONE SODIUM PHOSPHATE 10 MG/ML IJ SOLN
INTRAMUSCULAR | Status: AC
  Filled 2021-08-23: qty 1

## 2021-08-23 SURGICAL SUPPLY — 38 items
ADH SKN CLS APL DERMABOND .7 (GAUZE/BANDAGES/DRESSINGS) ×1
APL PRP STRL LF DISP 70% ISPRP (MISCELLANEOUS) ×1
APPLIER CLIP 9.375 MED OPEN (MISCELLANEOUS)
APR CLP MED 9.3 20 MLT OPN (MISCELLANEOUS)
BLADE SURG 15 STRL LF DISP TIS (BLADE) ×1 IMPLANT
BLADE SURG 15 STRL SS (BLADE) ×2
CANISTER SUC SOCK COL 7IN (MISCELLANEOUS) ×2 IMPLANT
CANISTER SUCT 1200ML W/VALVE (MISCELLANEOUS) ×2 IMPLANT
CHLORAPREP W/TINT 26 (MISCELLANEOUS) ×2 IMPLANT
CLIP APPLIE 9.375 MED OPEN (MISCELLANEOUS) IMPLANT
COVER BACK TABLE 60X90IN (DRAPES) ×2 IMPLANT
COVER MAYO STAND STRL (DRAPES) ×2 IMPLANT
COVER PROBE W GEL 5X96 (DRAPES) ×2 IMPLANT
DERMABOND ADVANCED (GAUZE/BANDAGES/DRESSINGS) ×1
DERMABOND ADVANCED .7 DNX12 (GAUZE/BANDAGES/DRESSINGS) ×1 IMPLANT
DRAPE LAPAROSCOPIC ABDOMINAL (DRAPES) ×2 IMPLANT
DRAPE UTILITY XL STRL (DRAPES) ×2 IMPLANT
ELECT COATED BLADE 2.86 ST (ELECTRODE) ×2 IMPLANT
ELECT REM PT RETURN 9FT ADLT (ELECTROSURGICAL) ×2
ELECTRODE REM PT RTRN 9FT ADLT (ELECTROSURGICAL) ×1 IMPLANT
GLOVE SURG ENC MOIS LTX SZ7.5 (GLOVE) ×4 IMPLANT
GOWN STRL REUS W/ TWL LRG LVL3 (GOWN DISPOSABLE) ×2 IMPLANT
GOWN STRL REUS W/TWL LRG LVL3 (GOWN DISPOSABLE) ×4
KIT MARKER MARGIN INK (KITS) ×2 IMPLANT
NDL HYPO 25X1 1.5 SAFETY (NEEDLE) IMPLANT
NEEDLE HYPO 25X1 1.5 SAFETY (NEEDLE) ×2 IMPLANT
NS IRRIG 1000ML POUR BTL (IV SOLUTION) IMPLANT
PACK BASIN DAY SURGERY FS (CUSTOM PROCEDURE TRAY) ×2 IMPLANT
PENCIL SMOKE EVACUATOR (MISCELLANEOUS) ×2 IMPLANT
SLEEVE SCD COMPRESS KNEE MED (STOCKING) ×2 IMPLANT
SPONGE T-LAP 18X18 ~~LOC~~+RFID (SPONGE) ×2 IMPLANT
SUT MON AB 4-0 PC3 18 (SUTURE) ×2 IMPLANT
SUT VICRYL 3-0 CR8 SH (SUTURE) ×2 IMPLANT
SYR CONTROL 10ML LL (SYRINGE) ×1 IMPLANT
TOWEL GREEN STERILE FF (TOWEL DISPOSABLE) ×2 IMPLANT
TRAY FAXITRON CT DISP (TRAY / TRAY PROCEDURE) ×2 IMPLANT
TUBE CONNECTING 20X1/4 (TUBING) ×2 IMPLANT
YANKAUER SUCT BULB TIP NO VENT (SUCTIONS) ×1 IMPLANT

## 2021-08-23 NOTE — Anesthesia Preprocedure Evaluation (Addendum)
Anesthesia Evaluation  Patient identified by MRN, date of birth, ID band Patient awake    Reviewed: Allergy & Precautions, H&P , NPO status , Patient's Chart, lab work & pertinent test results  Airway Mallampati: II  TM Distance: <3 FB Neck ROM: Full    Dental no notable dental hx.    Pulmonary asthma ,    Pulmonary exam normal breath sounds clear to auscultation       Cardiovascular hypertension, Pt. on medications Normal cardiovascular exam Rhythm:Regular Rate:Normal  EKG reviewed   Neuro/Psych Anxiety Benign brain tumor  Neuromuscular disease negative psych ROS   GI/Hepatic Neg liver ROS, hiatal hernia, GERD  ,dysphagia   Endo/Other  Hypothyroidism   Renal/GU negative Renal ROS  negative genitourinary   Musculoskeletal  (+) Arthritis ,   Abdominal   Peds negative pediatric ROS (+)  Hematology negative hematology ROS (+)   Anesthesia Other Findings Breast cancer  Reproductive/Obstetrics negative OB ROS                            Anesthesia Physical Anesthesia Plan  ASA: 3  Anesthesia Plan: General   Post-op Pain Management:    Induction: Intravenous  PONV Risk Score and Plan: 3 and Treatment may vary due to age or medical condition  Airway Management Planned: LMA  Additional Equipment:   Intra-op Plan:   Post-operative Plan: Extubation in OR  Informed Consent: I have reviewed the patients History and Physical, chart, labs and discussed the procedure including the risks, benefits and alternatives for the proposed anesthesia with the patient or authorized representative who has indicated his/her understanding and acceptance.     Dental advisory given  Plan Discussed with: Anesthesiologist, CRNA and Surgeon  Anesthesia Plan Comments:        Anesthesia Quick Evaluation

## 2021-08-23 NOTE — H&P (Signed)
PROVIDER: Landry Corporal, MD  MRN: [id number] DOB: 19-Feb-1941 Subjective  Chief Complaint: Breast Cancer   History of Present Illness: Kylie Taylor is a 80 y.o. female who is seen today as an office consultation at the request of Dr. Pasty Arch for evaluation of Breast Cancer .   We are asked to see the patient in consultation by Dr. Lindi Adie to evaluate her for a new left breast cancer. The patient is an 80 year old white female who recently went for a routine screening mammogram. At that time she was found to have a 1.5 cm area of calcs in the upper outer quadrant of the left breast. 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 She is otherwise in pretty good health for her age and does not smoke.  Review of Systems: A complete review of systems was obtained from the patient. I have reviewed this information and discussed as appropriate with the patient. See HPI as well for other ROS.  ROS   Medical History: Past Medical History:  Diagnosis Date   Anxiety  Date unknown   Arthritis  Date unknown   Asthma  Date unknown   Cancer (CMS-HCC)  Date unknown   GERD (gastroesophageal reflux disease)  Date unknown   Hyperlipidemia  Date unknown   Hypertension  Date unknown   Thyroid disorder  Date unknown   Patient Active Problem List  Diagnosis   Anxiety   Arthritis   Asthma   Cancer (CMS-HCC)   GERD (gastroesophageal reflux disease)   Hyperlipidemia   History of hypertension   Thyroid disorder   Ringing in the ears, unspecified laterality   Cough   Heartburn   History of dizziness   Headache   Easy bruising   Bleeds easily (CMS-HCC)   Ductal carcinoma in situ (DCIS) of left breast   Past Surgical History:  Procedure Laterality Date   ABDOMINAL HYSTERECTOMY W/ PARTIAL VAGINACTOMY N/A 1992   COLONOSCOPY N/A  Unknown date    Allergies  Allergen Reactions   Demerol [Meperidine] Nausea and Vomiting   Flagyl  [Metronidazole] Unknown   Current Outpatient Medications on File Prior to Visit  Medication Sig Dispense Refill   levothyroxine (SYNTHROID) 100 MCG tablet Take 100 mcg by mouth once daily Take on an empty stomach with a glass of water at least 30-60 minutes before breakfast.   losartan (COZAAR) 100 MG tablet Take 100 mg by mouth once daily   omeprazole (PRILOSEC) 20 MG DR capsule Take 20 mg by mouth once daily   No current facility-administered medications on file prior to visit.   Family History  Problem Relation Age of Onset   High blood pressure (Hypertension) Mother   High blood pressure (Hypertension) Father   Hyperlipidemia (Elevated cholesterol) Father   Myocardial Infarction (Heart attack) Father   Coronary Artery Disease (Blocked arteries around heart) Father   High blood pressure (Hypertension) Sister    Social History   Tobacco Use  Smoking Status Unknown If Ever Smoked  Smokeless Tobacco Never Used    Social History   Socioeconomic History   Marital status: Married  Tobacco Use   Smoking status: Unknown If Ever Smoked   Smokeless tobacco: Never Used  Scientific laboratory technician Use: Never used  Substance and Sexual Activity   Alcohol use: Never   Drug use: Never   Objective:  There were no vitals filed for this visit.  There is no height or weight on file to  calculate BMI.  Physical Exam Vitals reviewed.  Constitutional:  General: She is not in acute distress. Appearance: Normal appearance.  HENT:  Head: Normocephalic and atraumatic.  Right Ear: External ear normal.  Left Ear: External ear normal.  Nose: Nose normal.  Mouth/Throat:  Mouth: Mucous membranes are moist.  Pharynx: Oropharynx is clear.  Eyes:  General: No scleral icterus. Extraocular Movements: Extraocular movements intact.  Conjunctiva/sclera: Conjunctivae normal.  Pupils: Pupils are equal, round, and reactive to light.  Cardiovascular:  Rate and Rhythm: Normal rate and regular rhythm.   Pulses: Normal pulses.  Heart sounds: Normal heart sounds.  Pulmonary:  Effort: Pulmonary effort is normal. No respiratory distress.  Breath sounds: Normal breath sounds.  Abdominal:  General: Bowel sounds are normal.  Palpations: Abdomen is soft.  Tenderness: There is no abdominal tenderness.  Musculoskeletal:  General: No swelling, tenderness or deformity. Normal range of motion.  Cervical back: Normal range of motion and neck supple.  Skin: General: Skin is warm and dry.  Coloration: Skin is not jaundiced.  Neurological:  General: No focal deficit present.  Mental Status: She is alert and oriented to person, place, and time.  Psychiatric:  Mood and Affect: Mood normal.  Behavior: Behavior normal.     Breast: There is no palpable mass in either breast. There is no palpable axillary, supraclavicular, or cervical lymphadenopathy. She does have some slight bruising in the upper outer quadrant of the left breast  Labs, Imaging and Diagnostic Testing:  Assessment and Plan:  Diagnoses and all orders for this visit:  Ductal carcinoma in situ (DCIS) of left breast    The patient appears to have a small area of DCIS in the upper outer quadrant of the left breast. I have discussed with her in detail the different options for treatment and at this point she favors breast conservation which I feel is very reasonable. I have discussed with her in detail the risks and benefits of the operation as well as some of the technical aspects including the use of a radioactive seed for localization and she understands and wishes to proceed. She will meet with medical and radiation oncology to discuss adjuvant therapy. She will not need a node evaluation.

## 2021-08-23 NOTE — Interval H&P Note (Signed)
History and Physical Interval Note:  08/23/2021 10:37 AM  Kylie Taylor  has presented today for surgery, with the diagnosis of LEFT BREAST DCIS.  The various methods of treatment have been discussed with the patient and family. After consideration of risks, benefits and other options for treatment, the patient has consented to  Procedure(s): LEFT BREAST LUMPECTOMY WITH RADIOACTIVE SEED LOCALIZATION (Left) as a surgical intervention.  The patient's history has been reviewed, patient examined, no change in status, stable for surgery.  I have reviewed the patient's chart and labs.  Questions were answered to the patient's satisfaction.     Autumn Messing III

## 2021-08-23 NOTE — Op Note (Signed)
08/23/2021  12:06 PM  PATIENT:  Kylie Taylor  80 y.o. female  PRE-OPERATIVE DIAGNOSIS:  LEFT BREAST DCIS  POST-OPERATIVE DIAGNOSIS:  LEFT BREAST DCIS  PROCEDURE:  Procedure(s): LEFT BREAST LUMPECTOMY WITH RADIOACTIVE SEED LOCALIZATION (Left)  SURGEON:  Surgeon(s) and Role:    Jovita Kussmaul, MD - Primary  PHYSICIAN ASSISTANT:   ASSISTANTS: none   ANESTHESIA:   local and general  EBL:  10 mL   BLOOD ADMINISTERED:none  DRAINS: none   LOCAL MEDICATIONS USED:  MARCAINE     SPECIMEN:  Source of Specimen:  left breast tissue  DISPOSITION OF SPECIMEN:  PATHOLOGY  COUNTS:  YES  TOURNIQUET:  * No tourniquets in log *  DICTATION: .Dragon Dictation  After informed consent was obtained the patient was brought to the operating room and placed in the supine position on the operating table.  After adequate induction of general anesthesia the patient's left breast was prepped with ChloraPrep, allowed to dry, and draped in usual sterile manner.  An appropriate timeout was performed.  Previously an I-125 seed was placed in the upper outer quadrant of the left breast to mark an area of ductal carcinoma in situ.  The neoprobe was set to I-125 in the area of radioactivity was readily identified.  The area around this was infiltrated with quarter percent Marcaine.  A curvilinear incision was made along the upper edge of the areola of the left breast with a 15 blade knife.  The incision was carried through the skin and subcutaneous tissue sharply with the electrocautery.  Dissection was then carried out throughout the upper outer quadrant between the breast tissue and the subcutaneous fat and skin.  Once this dissection was well beyond the area of the cancer then I removed a circular portion of breast tissue sharply with the electrocautery around the radioactive seed while checking the area of radioactivity frequently.  Once the specimen was removed it was oriented with the appropriate paint  colors.  A specimen radiograph was obtained that showed the clip and seed to be near the center of the specimen.  The specimen was then sent to pathology for further evaluation.  Hemostasis was achieved using the Bovie electrocautery.  The wound was irrigated with saline and infiltrated with more quarter percent Marcaine.  The deep layer of the wound was then closed with layers of interrupted 3-0 Vicryl stitches.  The skin was closed with interrupted 4-0 Monocryl subcuticular stitches.  Dermabond dressings were applied.  The patient tolerated the procedure well.  At the end of the case all needle sponge and instrument counts were correct.  The patient was then awakened and taken to recovery in stable condition.  PLAN OF CARE: Discharge to home after PACU  PATIENT DISPOSITION:  PACU - hemodynamically stable.   Delay start of Pharmacological VTE agent (>24hrs) due to surgical blood loss or risk of bleeding: not applicable

## 2021-08-23 NOTE — Anesthesia Procedure Notes (Signed)
Procedure Name: LMA Insertion Date/Time: 08/23/2021 11:21 AM Performed by: Garrel Ridgel, CRNA Pre-anesthesia Checklist: Patient identified, Emergency Drugs available, Suction available and Patient being monitored Patient Re-evaluated:Patient Re-evaluated prior to induction Oxygen Delivery Method: Circle system utilized Preoxygenation: Pre-oxygenation with 100% oxygen Induction Type: IV induction Ventilation: Mask ventilation without difficulty LMA Size: 3.0 Number of attempts: 1 Placement Confirmation: positive ETCO2 Tube secured with: Tape Dental Injury: Teeth and Oropharynx as per pre-operative assessment

## 2021-08-23 NOTE — Discharge Instructions (Addendum)
**  You had 1000 mg of tylenol at 1100am **No ibuprofen/motrin until after 7pm   Post Anesthesia Home Care Instructions  Activity: Get plenty of rest for the remainder of the day. A responsible individual must stay with you for 24 hours following the procedure.  For the next 24 hours, DO NOT: -Drive a car -Paediatric nurse -Drink alcoholic beverages -Take any medication unless instructed by your physician -Make any legal decisions or sign important papers.  Meals: Start with liquid foods such as gelatin or soup. Progress to regular foods as tolerated. Avoid greasy, spicy, heavy foods. If nausea and/or vomiting occur, drink only clear liquids until the nausea and/or vomiting subsides. Call your physician if vomiting continues.  Special Instructions/Symptoms: Your throat may feel dry or sore from the anesthesia or the breathing tube placed in your throat during surgery. If this causes discomfort, gargle with warm salt water. The discomfort should disappear within 24 hours.  If you had a scopolamine patch placed behind your ear for the management of post- operative nausea and/or vomiting:  1. The medication in the patch is effective for 72 hours, after which it should be removed.  Wrap patch in a tissue and discard in the trash. Wash hands thoroughly with soap and water. 2. You may remove the patch earlier than 72 hours if you experience unpleasant side effects which may include dry mouth, dizziness or visual disturbances. 3. Avoid touching the patch. Wash your hands with soap and water after contact with the patch.

## 2021-08-23 NOTE — Anesthesia Postprocedure Evaluation (Signed)
Anesthesia Post Note  Patient: Kylie Taylor  Procedure(s) Performed: LEFT BREAST LUMPECTOMY WITH RADIOACTIVE SEED LOCALIZATION (Left: Breast)     Patient location during evaluation: PACU Anesthesia Type: General Level of consciousness: awake Pain management: pain level controlled Vital Signs Assessment: post-procedure vital signs reviewed and stable Respiratory status: spontaneous breathing and respiratory function stable Cardiovascular status: stable Postop Assessment: no apparent nausea or vomiting Anesthetic complications: no   No notable events documented.  Last Vitals:  Vitals:   08/23/21 1215 08/23/21 1230  BP: (!) 143/67 (!) 146/64  Pulse: 85 74  Resp: 14 19  Temp: 36.5 C   SpO2: 100% 99%    Last Pain:  Vitals:   08/23/21 1215  TempSrc:   PainSc: 0-No pain                 Merlinda Frederick

## 2021-08-23 NOTE — Transfer of Care (Signed)
Immediate Anesthesia Transfer of Care Note  Patient: Kylie Taylor  Procedure(s) Performed: LEFT BREAST LUMPECTOMY WITH RADIOACTIVE SEED LOCALIZATION (Left: Breast)  Patient Location: PACU  Anesthesia Type:General  Level of Consciousness: awake, alert , oriented and patient cooperative  Airway & Oxygen Therapy: Patient Spontanous Breathing and Patient connected to face mask oxygen  Post-op Assessment: Report given to RN and Post -op Vital signs reviewed and stable  Post vital signs: Reviewed and stable  Last Vitals:  Vitals Value Taken Time  BP 143/67 08/23/21 1215  Temp    Pulse 87 08/23/21 1216  Resp 12 08/23/21 1216  SpO2 100 % 08/23/21 1216  Vitals shown include unvalidated device data.  Last Pain:  Vitals:   08/23/21 1051  TempSrc: Oral  PainSc: 0-No pain      Patients Stated Pain Goal: 3 (14/83/07 3543)  Complications: No notable events documented.

## 2021-08-25 ENCOUNTER — Encounter (HOSPITAL_BASED_OUTPATIENT_CLINIC_OR_DEPARTMENT_OTHER): Payer: Self-pay | Admitting: General Surgery

## 2021-08-27 LAB — SURGICAL PATHOLOGY

## 2021-08-28 ENCOUNTER — Ambulatory Visit: Payer: Medicare Other | Attending: Hematology and Oncology | Admitting: Physical Therapy

## 2021-08-28 ENCOUNTER — Encounter: Payer: Self-pay | Admitting: Physical Therapy

## 2021-08-28 ENCOUNTER — Other Ambulatory Visit: Payer: Self-pay

## 2021-08-28 DIAGNOSIS — R296 Repeated falls: Secondary | ICD-10-CM | POA: Diagnosis not present

## 2021-08-28 DIAGNOSIS — R269 Unspecified abnormalities of gait and mobility: Secondary | ICD-10-CM | POA: Insufficient documentation

## 2021-08-28 DIAGNOSIS — R293 Abnormal posture: Secondary | ICD-10-CM | POA: Diagnosis present

## 2021-08-28 DIAGNOSIS — D0512 Intraductal carcinoma in situ of left breast: Secondary | ICD-10-CM | POA: Insufficient documentation

## 2021-08-28 NOTE — Therapy (Signed)
[facility] [address]  [place], [place], [address] Phone: [phone]   Fax:  [phone]  Physical Therapy Evaluation  Patient Details  Name: Kylie Taylor MRN: [id number] Date of Birth: 29-Oct-1940 Referring Provider (PT): Darien Ramus MD   Encounter Date: 08/28/2021   PT End of Session - 08/28/21 1649     Visit Number 1    Number of Visits 6    Date for PT Re-Evaluation 10/09/21    Authorization Type Medicare & [employer]    Progress Note Due on Visit 10    PT Start Time 1450    PT Stop Time 7341    PT Time Calculation (min) 40 min    Activity Tolerance Patient tolerated treatment well    Behavior During Therapy [facility] for tasks assessed/performed             Past Medical History:  Diagnosis Date   Allergy    seasonal   Angiomyolipoma    Anxiety    Asthma    Benign brain tumor ([place])    x3   Benign positional vertigo    Bilateral bunions    and pronated feet   Carpal tunnel syndrome    Cataract    DDD (degenerative disc disease)    Diverticulosis    2007 colon   Dysphagia    Food impaction of esophagus    multiple   GERD (gastroesophageal reflux disease)    Hiatal hernia    Hx of adenomatous polyp of colon 01/03/2016   Hyperlipemia    Hypertension    Hypothyroidism    Lumbar radiculopathy    recurrent since 1996   Multiple gastric polyps    Osteopenia    Rosacea    Small vessel disease, cerebrovascular    dx in 2012   Tick bites    2017- taking Doxycycline for this    Past Surgical History:  Procedure Laterality Date   BREAST LUMPECTOMY WITH RADIOACTIVE SEED LOCALIZATION Left 08/23/2021   Procedure: LEFT BREAST LUMPECTOMY WITH RADIOACTIVE SEED LOCALIZATION;  Surgeon: Jovita Kussmaul, MD;  Location: [facility];  Service: General;  Laterality: Left;   COLONOSCOPY     DILATION AND CURETTAGE OF [place]     for fibroids   UPPER  GASTROINTESTINAL ENDOSCOPY      There were no vitals filed for this visit.    Subjective Assessment - 08/28/21 1458     Subjective Patient reports history of falls over several years, most of those are due to clumsiness and not paying attention, like tripping over her choir robe.  She has had 2 recent unexplained falls, and when being evaluated for possible PT following breast surgery for DCIS, asked about PT and was referred.  She just had surgery on 12/1, and other than difficulty lifting her L arm, reports no pain from surgery.  She reports she was diagnosed with 3 benign brain tumors 10 years ago, these are being monitored.    Pertinent History recent L breast surgery for DCIS (08/23/21), history benign brain tumor, OA L knee, osteopenia, DDD, GERD, hypothyroidism, small vessel disease.    Patient Stated Goals to improve balance if needed    Currently in Pain? 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 2   unexplained falls, didn't trip over anything   Has the patient had a decrease in activity level because of a fear of falling?  No    Is the patient reluctant to leave their home because of a fear of falling?  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  She reports tightness in L shoulder following surgery.  She prefers to sit with legs crossed and leaning to the Left.  Noted scoliotic posture in standing.      Posture/Postural Control   Posture/Postural Control Postural limitations    Posture Comments scoliotic posture      ROM / Strength   AROM / PROM / Strength Strength      Strength   Overall Strength Within functional limits for tasks performed    Overall Strength Comments tested in sitting    Strength Assessment Site Hip;Knee    Right/Left Hip Right;Left    Right Hip Flexion 4+/5    Right Hip ABduction 5/5    Right Hip ADduction 5/5    Left Hip Flexion 4+/5    Left Hip ABduction 5/5    Left Hip ADduction 5/5    Right/Left Knee Right;Left    Right Knee Extension 5/5    Left Knee Extension 5/5      Transfers   Five time sit to stand comments  14 seconds, no UE assist      Ambulation/Gait   Gait velocity 1.2    Gait Comments prefers to go down stairs backwards to avoid L knee pain      Balance   Balance Assessed Yes      Standardized Balance Assessment   Standardized Balance Assessment Berg Balance Test;Vestibular Evaluation    Balance Master Testing Other/comments   m CTSIB - 30 sec each condition 1-4, minimal sway.     Berg Balance Test   Sit to Stand Able to stand without using hands and stabilize independently    Standing Unsupported Able to stand safely 2 minutes    Sitting with Back Unsupported but Feet Supported on Floor or Stool Able to sit safely and securely 2 minutes    Stand to Sit Sits safely with minimal use of hands    Transfers Able to transfer safely,  minor use of hands     Standing Unsupported with Eyes Closed Able to stand 10 seconds safely    Standing Unsupported with Feet Together Able to place feet together independently and stand 1 minute safely    From Standing, Reach Forward with Outstretched Arm Can reach forward >12 cm safely (5")    From Standing Position, Pick up Object from [place] to pick up shoe safely and easily    From Standing Position, Turn to Look Behind Over each Shoulder Looks behind one side only/other side shows less weight shift    Turn 360 Degrees Able to turn 360 degrees safely in 4 seconds or less    Standing Unsupported, Alternately Place Feet on Step/Stool Able to stand independently and safely and complete 8 steps in 20 seconds    Standing Unsupported, One Foot in Front Able to place foot tandem independently and hold 30 seconds    Standing on One Leg Able to lift leg independently and hold > 10 seconds   19 seconds on R foot, but only 1 second on L foot.   Total Score 54    Berg comment: low fall risk, no AD indicated      Functional Gait  Assessment   Gait assessed  Yes    Gait Level Surface Walks 20 ft in less than 5.5 sec, no assistive devices, good speed, no evidence for imbalance, normal gait pattern, deviates no more than 6 in outside of the 12 in walkway width.    Change in Gait Speed Able to smoothly change walking speed without loss of balance or gait deviation. Deviate no more than 6 in outside of the 12 in walkway width.    Gait with Horizontal Head Turns Performs head turns smoothly with no change in gait. Deviates no more than 6 in outside 12 in walkway width    Gait with Vertical Head Turns Performs head turns with no change in gait. Deviates no more than 6 in outside 12 in walkway width.    Gait and Pivot Turn Pivot turns safely within 3 sec and stops quickly with no loss of balance.    Step Over Obstacle Is able to step over one shoe box (4.5 in total height) but must slow down and adjust steps to clear box safely.  May require verbal cueing.    Gait with Narrow Base of Support Ambulates 7-9 steps.    Gait with Eyes Closed Walks 20 ft, slow speed, abnormal gait pattern, evidence for imbalance, deviates 10-15 in outside 12 in walkway width. Requires more than 9 sec to ambulate 20 ft.    Ambulating Backwards Walks 20 ft, slow speed, abnormal gait pattern, evidence for imbalance, deviates 10-15 in outside 12 in walkway width.    Steps Two feet to a stair, must use rail.   increased L knee pain with descent, can ascend with reciprocal pattern.   Total Score 21    FGA comment: <22/30 indicates increased risk of falls.                        Objective measurements completed on examination: See above findings.                  PT Short Term Goals - 08/28/21 1721       PT SHORT TERM GOAL #1   Title Pt. will complete FOTO for balance within 2 visits.    Time 2    Period Weeks  Status New    Target Date 09/11/21               PT Long Term Goals - 08/28/21 1722       PT LONG TERM GOAL #1   Title Pt. will be independent with advanced HEP for balance/posture.    Time 6    Period Weeks    Status New    Target Date 10/09/21      PT LONG TERM GOAL #2   Title Pt. will improve score on FGA by 6 points to decrease risk of falls.    Baseline 21/30    Time 6    Period Weeks    Status New    Target Date 10/09/21      PT LONG TERM GOAL #3   Title Pt. will be able to stand on LLE x 5 seconds to decrease risk of falls.    Baseline 0 seconds on L, 20 seconds on R    Time 6    Period Weeks    Status New    Target Date 10/09/21      PT LONG TERM GOAL #4   Title Patient will be educated on fall prevention strategies.    Time 6    Period Weeks    Status New    Target Date 10/09/21                    Plan - 08/28/21 1658     Clinical Impression Statement Kylie Taylor is an 80 year old female referred for balance training after reporting several falls.   Her static balance is very good overall, scoring 54/56 on Berg, however, while she was able to stand for 20 seconds on R foot, she was not able to maintain balance at all on her L foot.  She also demonstrates very scoliotic posture and noted some asymmetry with retro stepping today.  She scored 21/30 on FGA which is predictive of falls in older community dwelling adults.  She would benefit from skilled physical therapy to improve posture, symmetry with gait, and decrease risk of falls.    Personal Factors and Comorbidities Comorbidity 3+    Comorbidities recent L breast surgery for DCIS (08/23/21), history benign brain tumor, OA L knee, osteopenia, DDD, GERD, hypothyroidism, small vessel disease.    Examination-Activity Limitations Locomotion Level    Examination-Participation Restrictions Church;Occupation;Community Activity    Stability/Clinical Decision Making Stable/Uncomplicated    Clinical Decision Making Low    Rehab Potential Excellent    PT Frequency 1x / week    PT Duration 6 weeks    PT Treatment/Interventions ADLs/Self Care Home Management;Gait training;Stair training;Functional mobility training;Therapeutic activities;Therapeutic exercise;Balance training;Neuromuscular re-education;Patient/family education;Manual techniques;Joint Manipulations;Dry needling;Orthotic Fit/Training    PT Next Visit Plan FOTO for balance.  Assess posture and hip mobility.  Initial HEP for postural strengthening and balance.    Consulted and Agree with Plan of Care Patient             Patient will benefit from skilled therapeutic intervention in order to improve the following deficits and impairments:  Abnormal gait, Decreased balance, Decreased safety awareness, Postural dysfunction, Decreased strength  Visit Diagnosis: Repeated falls  Gait difficulty  Abnormal posture     Problem List Patient Active Problem List   Diagnosis Date Noted   Genetic testing 08/13/2021   Family history of  prostate cancer 07/26/2021   Family history of brain cancer 07/26/2021   Ductal carcinoma in situ (DCIS)  of left breast 07/19/2021   Chest pain 03/27/2018   Bilateral primary osteoarthritis of knee 08/27/2016   Hx of adenomatous polyp of colon 01/03/2016   HYPOTHYROIDISM 02/17/2009   HYPERLIPIDEMIA 02/17/2009   ANXIETY 02/17/2009   GERD 02/17/2009   DIVERTICULOSIS OF COLON 02/17/2009   DEGENERATIVE [place] DISEASE 02/17/2009   DYSPHAGIA UNSPECIFIED 02/17/2009    Rennie Natter, PT, DPT 08/28/2021, 5:33 PM  [facility] [address]  [place], [place], [address] Phone: [phone]   Fax:  [phone]  Name: Kylie Taylor MRN: [id number] Date of Birth: [birth year]

## 2021-08-29 ENCOUNTER — Encounter: Payer: Self-pay | Admitting: *Deleted

## 2021-09-03 NOTE — Assessment & Plan Note (Signed)
Screening mammogram: indeterminate grouped calcifications in the left breast. Diagnostic mammogram and [place]: 1.5 cm fine linear calcifications in the left breast.  Biopsy revealed high-grade DCIS ER 100%, PR 0%  Recommendation: 1. Breast conserving surgery 08/23/2021:Left lumpectomy: Focal DCIS high-grade with calcifications, margins negative, ER 100%, PR 0% 2. +/- adjuvant radiation therapy 3. Followed by antiestrogen therapy with tamoxifen 5 years  Pathology counseling: I discussed the final pathology report of the patient provided  a copy of this report. I discussed the margins.  We also discussed the final staging along with previously performed ER/PR testing.  We will discuss with radiation oncology regarding pros and cons of radiation.  Return to clinic in 3 months for survivorship care plan visit

## 2021-09-03 NOTE — Progress Notes (Signed)
Patient Care Team: Derinda Late, MD as PCP - General (Family Medicine) Rockwell Germany, RN as Oncology Nurse Navigator Tressie Ellis, Paulette Blanch, RN as Oncology Nurse Navigator Jovita Kussmaul, MD as Consulting Physician (General Surgery) Nicholas Lose, MD as Consulting Physician (Hematology and Oncology) Kyung Rudd, MD as Consulting Physician (Radiation Oncology)  DIAGNOSIS:    ICD-10-CM   1. Ductal carcinoma in situ (DCIS) of left breast  D05.12       SUMMARY OF ONCOLOGIC HISTORY: Oncology History  Ductal carcinoma in situ (DCIS) of left breast  07/19/2021 Initial Diagnosis   Screening mammogram: indeterminate grouped calcifications in the left breast. Diagnostic mammogram and [place]: 1.5 cm fine linear calcifications in the left breast.  Biopsy revealed high-grade DCIS ER 100%, PR 0%   07/25/2021 Cancer Staging   Staging form: Breast, AJCC 8th Edition - Clinical stage from 07/25/2021: Stage 0 (cTis (DCIS), cN0, cM0, G3, ER+, PR-) - Signed by Nicholas Lose, MD on 07/25/2021 Stage prefix: Initial diagnosis Nuclear grade: G3 Histologic grading system: 3 grade system     Genetic Testing   [facility] CancerNext-Expanded Panel was Negative. Report date is 08/07/2021.  The CancerNext-Expanded gene panel offered by [facility] and includes sequencing, rearrangement, and RNA analysis for the following 77 genes: AIP, ALK, APC, ATM, AXIN2, BAP1, BARD1, BLM, BMPR1A, BRCA1, BRCA2, BRIP1, CDC73, CDH1, CDK4, CDKN1B, CDKN2A, CHEK2, CTNNA1, DICER1, FANCC, FH, FLCN, GALNT12, KIF1B, LZTR1, MAX, MEN1, MET, MLH1, MSH2, MSH3, MSH6, MUTYH, NBN, NF1, NF2, NTHL1, PALB2, PHOX2B, PMS2, POT1, PRKAR1A, PTCH1, PTEN, RAD51C, RAD51D, RB1, RECQL, RET, SDHA, SDHAF2, SDHB, SDHC, SDHD, SMAD4, SMARCA4, SMARCB1, SMARCE1, STK11, SUFU, TMEM127, TP53, TSC1, TSC2, VHL and XRCC2 (sequencing and deletion/duplication); EGFR, EGLN1, HOXB13, KIT, MITF, PDGFRA, POLD1, and POLE (sequencing only); EPCAM and GREM1 (deletion/duplication only).     08/23/2021 Surgery   Left lumpectomy: Focal DCIS high-grade with calcifications, margins negative, ER 100%, PR 0%     CHIEF COMPLIANT: Follow-up of left breast cancer  INTERVAL HISTORY: Kylie Taylor is a 80 y.o. with above-mentioned history of left breast cancer. Left lumpectomy on 08/23/2021 showed high grade DCIS with calcifications. She presents to the clinic today for follow-up.   ALLERGIES:  is allergic to atorvastatin calcium, coffee bean extract [coffea arabica], demerol [meperidine hcl], metronidazole, other, tobacco [tobacco], and triamterene.  MEDICATIONS:  Current Outpatient Medications  Medication Sig Dispense Refill   albuterol (PROVENTIL HFA;VENTOLIN HFA) 108 (90 Base) MCG/ACT inhaler Inhale 2 puffs into the lungs every 6 (six) hours as needed for wheezing or shortness of breath. Reported on 12/11/2015     amLODipine (NORVASC) 5 MG tablet Take 5 mg by mouth daily.     BIOTIN PO Take 1 capsule by mouth daily.     Calcium Citrate-Vitamin D (CITRACAL + D PO) Take 1 tablet by mouth daily.      HYDROcodone-acetaminophen (NORCO/VICODIN) 5-325 MG tablet Take 1 tablet by mouth every 6 (six) hours as needed for moderate pain or severe pain. 15 tablet 0   levothyroxine (SYNTHROID) 100 MCG tablet Take 100 mcg by mouth daily before breakfast.     losartan (COZAAR) 100 MG tablet Take 100 mg by mouth daily.     meloxicam (MOBIC) 7.5 MG tablet Take 7.5 mg by mouth as needed for pain.      omeprazole (PRILOSEC) 20 MG capsule Take 20 mg by mouth daily.     rosuvastatin (CRESTOR) 20 MG tablet Take 20 mg by mouth daily.     VITAMIN D, CHOLECALCIFEROL, PO Take 2,000  Units by mouth daily.     No current facility-administered medications for this visit.    PHYSICAL EXAMINATION: ECOG PERFORMANCE STATUS: 1 - Symptomatic but completely ambulatory  Vitals:   09/04/21 0855  BP: (!) 166/73  Pulse: 68  Resp: 18  Temp: (!) 97.2 F (36.2 C)  SpO2: 100%   Filed Weights   09/04/21 0855   Weight: 169 lb 3.2 oz (76.7 kg)    BREAST: No palpable masses or nodules in either right or left breasts. No palpable axillary supraclavicular or infraclavicular adenopathy no breast tenderness or nipple discharge. (exam performed in the presence of a chaperone)  LABORATORY DATA:  I have reviewed the data as listed CMP Latest Ref Rng & Units 07/25/2021 03/27/2018 02/03/2011  Glucose 70 - 99 mg/dL 105(H) 101(H) 95  BUN 8 - 23 mg/dL _0 Creatinine 0.44 - 1.00 mg/dL 1.19(H) 1.09(H) 0.80  Sodium 135 - 145 mmol/L 140 139 144  Potassium 3.5 - 5.1 mmol/L 3.8 4.0 3.3(L)  Chloride 98 - 111 mmol/L 108 109 104  CO2 22 - 32 mmol/L _1 Calcium 8.9 - 10.3 mg/dL 8.9 8.4(L) 10.2  Total Protein 6.5 - 8.1 g/dL 6.4(L) 6.2(L) -  Total Bilirubin 0.3 - 1.2 mg/dL 1.0 0.4 -  Alkaline Phos 38 - 126 U/L 55 75 -  AST 15 - 41 U/L 19 25 -  ALT 0 - 44 U/L 15 23 -    Lab Results  Component Value Date   WBC 4.3 07/25/2021   HGB 13.7 07/25/2021   HCT 39.4 07/25/2021   MCV 88.7 07/25/2021   PLT 166 07/25/2021   NEUTROABS 2.8 07/25/2021    ASSESSMENT & PLAN:  Ductal carcinoma in situ (DCIS) of left breast Screening mammogram: indeterminate grouped calcifications in the left breast. Diagnostic mammogram and [place]: 1.5 cm fine linear calcifications in the left breast.  Biopsy revealed high-grade DCIS ER 100%, PR 0%  Recommendation: 1. Breast conserving surgery 08/23/2021:Left lumpectomy: Focal DCIS high-grade with calcifications, margins negative, ER 100%, PR 0% 2. +/- adjuvant radiation therapy (patient does not want to do radiation) 3. Followed by antiestrogen therapy with tamoxifen 5 years (5 mg daily based upon recent clinical trials showing equivalent results at this dosage)  Pathology counseling: I discussed the final pathology report of the patient provided  a copy of this report. I discussed the margins.  We also discussed the final staging along with previously performed ER/PR testing.  Return  to clinic in 3 months for survivorship care plan visit   No orders of the defined types were placed in this encounter.  The patient has a good understanding of the overall plan. she agrees with it. she will call with any problems that may develop before the next visit here.  Total time spent: 30 mins including face to face time and time spent for planning, charting and coordination of care  Rulon Eisenmenger, MD, MPH 09/04/2021  I, Thana Ates, am acting as scribe for Dr. Nicholas Lose.  I have reviewed the above documentation for accuracy and completeness, and I agree with the above.

## 2021-09-04 ENCOUNTER — Encounter: Payer: Self-pay | Admitting: *Deleted

## 2021-09-04 ENCOUNTER — Ambulatory Visit: Payer: Medicare Other

## 2021-09-04 ENCOUNTER — Other Ambulatory Visit: Payer: Self-pay

## 2021-09-04 ENCOUNTER — Inpatient Hospital Stay: Payer: Medicare Other | Attending: Hematology and Oncology | Admitting: Hematology and Oncology

## 2021-09-04 DIAGNOSIS — D0512 Intraductal carcinoma in situ of left breast: Secondary | ICD-10-CM | POA: Diagnosis not present

## 2021-09-04 DIAGNOSIS — Z170 Estrogen receptor positive status [ER+]: Secondary | ICD-10-CM | POA: Insufficient documentation

## 2021-09-04 MED ORDER — TAMOXIFEN CITRATE 10 MG PO TABS: 5.0000 mg | ORAL_TABLET | Freq: Two times a day (BID) | ORAL | 3 refills | Status: DC

## 2021-09-07 ENCOUNTER — Ambulatory Visit: Payer: Medicare Other | Admitting: Physical Therapy

## 2021-09-07 ENCOUNTER — Other Ambulatory Visit: Payer: Self-pay

## 2021-09-07 ENCOUNTER — Encounter: Payer: Self-pay | Admitting: Physical Therapy

## 2021-09-07 DIAGNOSIS — R293 Abnormal posture: Secondary | ICD-10-CM

## 2021-09-07 DIAGNOSIS — R269 Unspecified abnormalities of gait and mobility: Secondary | ICD-10-CM

## 2021-09-07 DIAGNOSIS — R296 Repeated falls: Secondary | ICD-10-CM

## 2021-09-07 NOTE — Therapy (Addendum)
[facility] [address]  [place], [place], [address] Phone: [phone]   Fax:  [phone]  Physical Therapy Treatment  Patient Details  Name: Kylie Taylor MRN: [id number] Date of Birth: Jul 07, 1941 Referring Provider (PT): Darien Ramus MD   Encounter Date: 09/07/2021   PT End of Session - 09/07/21 0942     Visit Number 2    Number of Visits 6    Date for PT Re-Evaluation 10/09/21    Authorization Type Medicare & [employer]    Progress Note Due on Visit 10    PT Start Time [phone]   Pt arrived late   PT Stop Time 1031    PT Time Calculation (min) 49 min    Activity Tolerance Patient tolerated treatment well    Behavior During Therapy [facility] for tasks assessed/performed             Past Medical History:  Diagnosis Date   Allergy    seasonal   Angiomyolipoma    Anxiety    Asthma    Benign brain tumor ([place])    x3   Benign positional vertigo    Bilateral bunions    and pronated feet   Carpal tunnel syndrome    Cataract    DDD (degenerative disc disease)    Diverticulosis    2007 colon   Dysphagia    Food impaction of esophagus    multiple   GERD (gastroesophageal reflux disease)    Hiatal hernia    Hx of adenomatous polyp of colon 01/03/2016   Hyperlipemia    Hypertension    Hypothyroidism    Lumbar radiculopathy    recurrent since 1996   Multiple gastric polyps    Osteopenia    Rosacea    Small vessel disease, cerebrovascular    dx in 2012   Tick bites    2017- taking Doxycycline for this    Past Surgical History:  Procedure Laterality Date   BREAST LUMPECTOMY WITH RADIOACTIVE SEED LOCALIZATION Left 08/23/2021   Procedure: LEFT BREAST LUMPECTOMY WITH RADIOACTIVE SEED LOCALIZATION;  Surgeon: Jovita Kussmaul, MD;  Location: [facility];  Service: General;  Laterality: Left;   COLONOSCOPY     DILATION AND CURETTAGE OF [place]     for  fibroids   UPPER GASTROINTESTINAL ENDOSCOPY      There were no vitals filed for this visit.   Subjective Assessment - 09/07/21 0947     Subjective Pt reports she has been swimming already this morning - feels like swimming helps with managing her back issues.    Pertinent History recent L breast surgery for DCIS (08/23/21), history benign brain tumor, OA L knee, osteopenia, DDD, GERD, hypothyroidism, small vessel disease.    Patient Stated Goals to improve balance if needed    Currently in Pain? No/denies                [facility] PT Assessment - 09/07/21 0942       Observation/Other Assessments   Focus on Therapeutic Outcomes (FOTO)  Balance = 60, predicted D/C FS = 61      Posture/Postural Control   Posture/Postural Control Postural limitations    Postural Limitations Forward head;Right pelvic obliquity;Flexed trunk    Posture Comments scoliotic posture - shoulders shifted L & hips shifted R; L knee genu valgum with knee flexed in stance - when pt attempts to strsightne  L knee she compensates with increased hip flexion                           [facility] Adult PT Treatment/Exercise - 09/07/21 0942       Lumbar Exercises: Stretches   Passive Hamstring Stretch Right;Left;1 rep;30 seconds    Passive Hamstring Stretch Limitations hooklying with strap    Lower Trunk Rotation Limitations 5 x 10"    Hip Flexor Stretch Right;1 rep;30 seconds    Hip Flexor Stretch Limitations mod thomas    Piriformis Stretch Right;Left;1 rep;30 seconds    Piriformis Stretch Limitations hooklying KTOS    Figure 4 Stretch 1 rep;30 seconds;Supine;With overpressure    Other Lumbar Stretch Exercise B frog leg hip ADD stretch x 30 sec      Knee/Hip Exercises: Aerobic   Nustep L3 x 6 min                     PT Education - 09/07/21 1030     Education Details Initial HEP - lumbopelvic/proximal LE stretching - Access Code: [email or web address]    Person(s) Educated Patient    Methods  Explanation;Demonstration;Verbal cues;Tactile cues;Handout    Comprehension Verbalized understanding;Verbal cues required;Tactile cues required;Returned demonstration;Need further instruction              PT Short Term Goals - 09/07/21 1036       PT SHORT TERM GOAL #1   Title Pt. will complete FOTO for balance within 2 visits.    Status Achieved   09/07/21              PT Long Term Goals - 09/07/21 1040       PT LONG TERM GOAL #1   Title Pt. will be independent with advanced HEP for balance/posture.    Status On-going    Target Date 10/09/21      PT LONG TERM GOAL #2   Title Pt. will improve score on FGA by 6 points to decrease risk of falls.    Baseline 21/30    Status On-going    Target Date 10/09/21      PT LONG TERM GOAL #3   Title Pt. will be able to stand on LLE x 5 seconds to decrease risk of falls.    Baseline 0 seconds on L, 20 seconds on R    Status On-going    Target Date 10/09/21      PT LONG TERM GOAL #4   Title Patient will be educated on fall prevention strategies.    Status On-going    Target Date 10/09/21                   Plan - 09/07/21 3785     Clinical Impression Statement Completed posture and flexibility assessment with lumbopelvic and hip/proximal LE tightness contributing to abnormal posture along with scoliosis and degenerative spinal changes, hip and knee OA reported by pt. Initial HEP created to promote improved flexibility to allow for better posture with pt able to perform good return demonstration. Will plan to expand HEP to include core and postural strengthening as well as balance activities next visit.    Comorbidities recent L breast surgery for DCIS (08/23/21), history benign brain tumor, OA L knee, osteopenia, DDD, GERD, hypothyroidism, small vessel disease.    Rehab Potential Excellent    PT Frequency 1x / week    PT Duration 6 weeks    PT Treatment/Interventions  ADLs/Self Care Home Management;Gait training;Stair  training;Functional mobility training;Therapeutic activities;Therapeutic exercise;Balance training;Neuromuscular re-education;Patient/family education;Manual techniques;Joint Manipulations;Dry needling;Orthotic Fit/Training    PT Next Visit Plan Review initial HEP and progress to include postural strengthening and balance.    PT Home Exercise Plan Access Code: [email or web address] (12/16)    Consulted and Agree with Plan of Care Patient             Patient will benefit from skilled therapeutic intervention in order to improve the following deficits and impairments:  Abnormal gait, Decreased balance, Decreased safety awareness, Postural dysfunction, Decreased strength  Visit Diagnosis: Repeated falls  Gait difficulty  Abnormal posture     Problem List Patient Active Problem List   Diagnosis Date Noted   Genetic testing 08/13/2021   Family history of prostate cancer 07/26/2021   Family history of brain cancer 07/26/2021   Ductal carcinoma in situ (DCIS) of left breast 07/19/2021   Chest pain 03/27/2018   Bilateral primary osteoarthritis of knee 08/27/2016   Hx of adenomatous polyp of colon 01/03/2016   HYPOTHYROIDISM 02/17/2009   HYPERLIPIDEMIA 02/17/2009   ANXIETY 02/17/2009   GERD 02/17/2009   DIVERTICULOSIS OF COLON 02/17/2009   DEGENERATIVE [place] DISEASE 02/17/2009   DYSPHAGIA UNSPECIFIED 02/17/2009    Percival Spanish, PT 09/07/2021, 10:48 AM  [facility] [address]  [place], [place], [address] Phone: [phone]   Fax:  [phone]  Name: Kylie Taylor MRN: [id number] Date of Birth: 1941-08-21

## 2021-09-07 NOTE — Patient Instructions (Signed)
° °  Access Code: [email or web address] URL: [email or web address] Date: 09/07/2021 Prepared by: Annie Paras  Exercises Hooklying Hamstring Stretch with Strap - 2-3 x daily - 7 x weekly - 3 reps - 30 sec hold Modified Thomas Stretch - 2-3 x daily - 7 x weekly - 3 reps - 30 sec hold Supine Figure 4 Piriformis Stretch - 2-3 x daily - 7 x weekly - 3 reps - 30 sec hold Supine Piriformis Stretch with Foot on Ground - 2-3 x daily - 7 x weekly - 3 reps - 30 sec hold Supine Lower Trunk Rotation - 2-3 x daily - 7 x weekly - 5 reps - 10 sec hold

## 2021-09-11 ENCOUNTER — Ambulatory Visit: Payer: Medicare Other

## 2021-09-18 ENCOUNTER — Ambulatory Visit: Payer: Medicare Other | Admitting: Physical Therapy

## 2021-09-25 ENCOUNTER — Ambulatory Visit: Payer: Medicare Other | Admitting: Physical Therapy

## 2021-09-27 ENCOUNTER — Ambulatory Visit: Payer: Medicare Other | Admitting: Radiation Oncology

## 2021-09-27 ENCOUNTER — Ambulatory Visit: Payer: Medicare Other

## 2021-10-02 ENCOUNTER — Encounter: Payer: Self-pay | Admitting: *Deleted

## 2021-10-02 ENCOUNTER — Ambulatory Visit: Payer: Medicare Other | Attending: Hematology and Oncology | Admitting: Physical Therapy

## 2021-10-02 ENCOUNTER — Other Ambulatory Visit: Payer: Self-pay

## 2021-10-02 ENCOUNTER — Encounter: Payer: Self-pay | Admitting: Physical Therapy

## 2021-10-02 DIAGNOSIS — R269 Unspecified abnormalities of gait and mobility: Secondary | ICD-10-CM

## 2021-10-02 DIAGNOSIS — R296 Repeated falls: Secondary | ICD-10-CM | POA: Diagnosis present

## 2021-10-02 DIAGNOSIS — R293 Abnormal posture: Secondary | ICD-10-CM | POA: Diagnosis present

## 2021-10-02 DIAGNOSIS — D0512 Intraductal carcinoma in situ of left breast: Secondary | ICD-10-CM

## 2021-10-02 NOTE — Patient Instructions (Signed)
° °  Access Code: [email or web address] URL: [email or web address] Date: 10/02/2021 Prepared by: Annie Paras  Exercises Hooklying Hamstring Stretch with Strap - 2-3 x daily - 7 x weekly - 3 reps - 30 sec hold Modified Thomas Stretch - 2-3 x daily - 7 x weekly - 3 reps - 30 sec hold Supine Figure 4 Piriformis Stretch - 2-3 x daily - 7 x weekly - 3 reps - 30 sec hold Supine Piriformis Stretch with Foot on Ground - 2-3 x daily - 7 x weekly - 3 reps - 30 sec hold Supine Lower Trunk Rotation - 2-3 x daily - 7 x weekly - 5 reps - 10 sec hold Gastroc Stretch on Step - 2-3 x daily - 7 x weekly - 3 reps - 30 sec hold Seated Hamstring Stretch with Strap - 2-3 x daily - 7 x weekly - 3 reps - 30 sec hold  Patient Education Check for Safety

## 2021-10-02 NOTE — Therapy (Signed)
[facility] [address]  [place], [place], [address] Phone: [phone]   Fax:  [phone]  Physical Therapy Treatment / Recert  Patient Details  Name: Kylie Taylor MRN: [id number] Date of Birth: April 24, 1941 Referring Provider (PT): Darien Ramus, MD  Progress Note  Reporting Period 08/28/2021 to 10/02/2021  See note below for Objective Data and Assessment of Progress/Goals.     Encounter Date: 10/02/2021   PT End of Session - 10/02/21 1100     Visit Number 3    Number of Visits 7    Date for PT Re-Evaluation 10/30/21    Authorization Type Medicare & Aetna    Progress Note Due on Visit --    PT Start Time 1100    PT Stop Time 1152    PT Time Calculation (min) 52 min    Activity Tolerance Patient tolerated treatment well    Behavior During Therapy WFL for tasks assessed/performed             Past Medical History:  Diagnosis Date   Allergy    seasonal   Angiomyolipoma    Anxiety    Asthma    Benign brain tumor ([place])    x3   Benign positional vertigo    Bilateral bunions    and pronated feet   Carpal tunnel syndrome    Cataract    DDD (degenerative disc disease)    Diverticulosis    2007 colon   Dysphagia    Food impaction of esophagus    multiple   GERD (gastroesophageal reflux disease)    Hiatal hernia    Hx of adenomatous polyp of colon 01/03/2016   Hyperlipemia    Hypertension    Hypothyroidism    Lumbar radiculopathy    recurrent since 1996   Multiple gastric polyps    Osteopenia    Rosacea    Small vessel disease, cerebrovascular    dx in 2012   Tick bites    2017- taking Doxycycline for this    Past Surgical History:  Procedure Laterality Date   BREAST LUMPECTOMY WITH RADIOACTIVE SEED LOCALIZATION Left 08/23/2021   Procedure: LEFT BREAST LUMPECTOMY WITH RADIOACTIVE SEED LOCALIZATION;  Surgeon: Jovita Kussmaul, MD;  Location: [facility];  Service: General;   Laterality: Left;   COLONOSCOPY     DILATION AND CURETTAGE OF [place]     for fibroids   UPPER GASTROINTESTINAL ENDOSCOPY      There were no vitals filed for this visit.   Subjective Assessment - 10/02/21 1105     Subjective Extended absence from PT due to COVID infection with limited completion fo her HEP due to feeling poorly. Pt reports her knee got "stuck" doing one of her stretches yesterday. No falls since starting PT.    Pertinent History recent L breast surgery for DCIS (08/23/21), history benign brain tumor, OA L knee, osteopenia, DDD, GERD, hypothyroidism, small vessel disease.    Patient Stated Goals to improve balance if needed    Currently in Pain? No/denies                [facility] PT Assessment - 10/02/21 1100       Assessment   Medical Diagnosis H/o falling    Referring Provider (PT) Darien Ramus, MD      Functional Tests   Functional tests Single leg stance  Single Leg Stance   Comments R = 3.41 sec, L = 13.65 sec      Standardized Balance Assessment   Standardized Balance Assessment 10 meter walk test    10 Meter Walk 10.44 sec at normal walking speed; 8.12 sec at fastest comfortable walking speed      Functional Gait  Assessment   Gait Level Surface Walks 20 ft in less than 5.5 sec, no assistive devices, good speed, no evidence for imbalance, normal gait pattern, deviates no more than 6 in outside of the 12 in walkway width.    Change in Gait Speed Able to smoothly change walking speed without loss of balance or gait deviation. Deviate no more than 6 in outside of the 12 in walkway width.    Gait with Horizontal Head Turns Performs head turns smoothly with slight change in gait velocity (eg, minor disruption to smooth gait path), deviates 6-10 in outside 12 in walkway width, or uses an assistive device.    Gait with Vertical Head Turns Performs head turns with no change in gait. Deviates no more than 6 in  outside 12 in walkway width.    Gait and Pivot Turn Pivot turns safely within 3 sec and stops quickly with no loss of balance.    Step Over Obstacle Is able to step over one shoe box (4.5 in total height) without changing gait speed. No evidence of imbalance.    Gait with Narrow Base of Support Is able to ambulate for 10 steps heel to toe with no staggering.    Gait with Eyes Closed Walks 20 ft, uses assistive device, slower speed, mild gait deviations, deviates 6-10 in outside 12 in walkway width. Ambulates 20 ft in less than 9 sec but greater than 7 sec.    Ambulating Backwards Walks 20 ft, slow speed, abnormal gait pattern, evidence for imbalance, deviates 10-15 in outside 12 in walkway width.    Steps Alternating feet, must use rail.   able to ascend with reciprocal pattern and can descend alternating feet backwards, but if attempts to descend forward has to go 2 feet per step due to L knee pain   Total Score 24    FGA comment: 19-24 = medium risk fall                           [facility] Adult PT Treatment/Exercise - 10/02/21 1100       Lumbar Exercises: Stretches   Passive Hamstring Stretch Right;Left;2 reps;30 seconds    Passive Hamstring Stretch Limitations seated hip hinge + strap for gastroc stretch    Gastroc Stretch Right;Left;2 reps;30 seconds   each position   Gastroc Stretch Limitations runner's stretch at wall & negative heel off edge of step      Knee/Hip Exercises: Aerobic   Nustep L3 x 6 min (UE/LE)                     PT Education - 10/02/21 1150     Education Details HEP update - stretches to reduce LE cramping at night - Access Code: [email or web address]; Fall Prevention - Check for Safety    Person(s) Educated Patient    Methods Explanation;Demonstration;Verbal cues;Handout    Comprehension Verbalized understanding;Verbal cues required;Returned demonstration;Need further instruction              PT Short Term Goals - 09/07/21 1036       PT  SHORT TERM GOAL #1  Title Pt. will complete FOTO for balance within 2 visits.    Status Achieved   09/07/21              PT Long Term Goals - 10/02/21 1109       PT LONG TERM GOAL #1   Title Pt. will be independent with advanced HEP for balance/posture.    Status On-going   10/02/21 - independent with initial HEP provided for lumbopelvic and LE flexibilty, but have not yet been able to address posture and balance due to extended absence from PT   Target Date 10/30/21      PT LONG TERM GOAL #2   Title Pt. will improve score on FGA by 6 points to decrease risk of falls.    Baseline 21/30 (08/28/21);  improved to 24/30 (10/02/21)    Status On-going    Target Date 10/30/21      PT LONG TERM GOAL #3   Title Pt. will be able to stand on L & R  single LE x 5 seconds to decrease risk of falls.    Baseline 0 seconds on L, 20 seconds on R (08/28/21); R = 3.41 sec, L = 13.65 sec (10/02/21)    Status Revised    Target Date 10/30/21      PT LONG TERM GOAL #4   Title Patient will be educated on fall prevention strategies.    Status Partially Met   10/02/21 - Falls safety checklist provided today   Target Date 10/30/21                   Plan - 10/02/21 1152     Clinical Impression Statement Kylie Taylor reports limited completion of her HEP during her extended absence from PT due to COVID infection and 1 cancelled visit due to therapist illness. HEP going well other than she notes her knee/leg got stuck in the figure-4 position during the piriformis stretch yesterday. She also notes intermittent issues with LE cramping at night, predominantly in her calves but occasionally in her HS, which will awaken her - added seated HS and negative heel calf stretches to be performed before bed to reduce likelihood or cramping at night. As we are reaching the end of her current certification but have only had 3 visits to date due to above missed visits, completed reassessment with variable progress  noted but continued ongoing balance deficits identified - will recommend recert for additional 1x/wk x 4 weeks to further address posture and balance to reduce risk for future falls.    Comorbidities recent L breast surgery for DCIS (08/23/21), history benign brain tumor, OA L knee, osteopenia, DDD, GERD, hypothyroidism, small vessel disease.    Rehab Potential Excellent    PT Frequency 1x / week    PT Duration 4 weeks    PT Treatment/Interventions ADLs/Self Care Home Management;Gait training;Stair training;Functional mobility training;Therapeutic activities;Therapeutic exercise;Balance training;Neuromuscular re-education;Patient/family education;Manual techniques;Joint Manipulations;Dry needling;Orthotic Fit/Training    PT Next Visit Plan progress therapeutic exercises & activities to include postural strengthening and balance - update HEP as indicated    PT Home Exercise Plan Access Code: [email or web address] (12/16, updated 1/10)    Consulted and Agree with Plan of Care Patient             Patient will benefit from skilled therapeutic intervention in order to improve the following deficits and impairments:  Abnormal gait, Decreased balance, Decreased safety awareness, Postural dysfunction, Decreased strength  Visit Diagnosis: Repeated falls  Gait difficulty  Abnormal  posture     Problem List Patient Active Problem List   Diagnosis Date Noted   Genetic testing 08/13/2021   Family history of prostate cancer 07/26/2021   Family history of brain cancer 07/26/2021   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  [place], [place], [address] Phone: [phone]   Fax:  [phone]  Name: Kylie Taylor MRN: [id number] Date of Birth: Oct 10, 1940

## 2021-10-11 ENCOUNTER — Ambulatory Visit: Payer: Medicare Other | Admitting: Physical Therapy

## 2021-10-11 ENCOUNTER — Other Ambulatory Visit: Payer: Self-pay

## 2021-10-11 DIAGNOSIS — R293 Abnormal posture: Secondary | ICD-10-CM

## 2021-10-11 DIAGNOSIS — R269 Unspecified abnormalities of gait and mobility: Secondary | ICD-10-CM

## 2021-10-11 DIAGNOSIS — R296 Repeated falls: Secondary | ICD-10-CM

## 2021-10-11 NOTE — Therapy (Signed)
[facility] [address]  [place], [place], [address] Phone: [phone]   Fax:  [phone]  Physical Therapy Treatment  Patient Details  Name: Kylie Taylor MRN: [id number] Date of Birth: [birth year] Referring Provider (PT): Darien Ramus, MD   Encounter Date: 10/11/2021   PT End of Session - 10/11/21 0940     Visit Number 4    Number of Visits 7    Date for PT Re-Evaluation 10/30/21    Authorization Type Medicare & Aetna    PT Start Time 0940   Pt arrived late   PT Stop Time 1024    PT Time Calculation (min) 44 min    Activity Tolerance Patient tolerated treatment well    Behavior During Therapy [facility] for tasks assessed/performed             Past Medical History:  Diagnosis Date   Allergy    seasonal   Angiomyolipoma    Anxiety    Asthma    Benign brain tumor ([place])    x3   Benign positional vertigo    Bilateral bunions    and pronated feet   Carpal tunnel syndrome    Cataract    DDD (degenerative disc disease)    Diverticulosis    2007 colon   Dysphagia    Food impaction of esophagus    multiple   GERD (gastroesophageal reflux disease)    Hiatal hernia    Hx of adenomatous polyp of colon 01/03/2016   Hyperlipemia    Hypertension    Hypothyroidism    Lumbar radiculopathy    recurrent since 1996   Multiple gastric polyps    Osteopenia    Rosacea    Small vessel disease, cerebrovascular    dx in 2012   Tick bites    2017- taking Doxycycline for this    Past Surgical History:  Procedure Laterality Date   BREAST LUMPECTOMY WITH RADIOACTIVE SEED LOCALIZATION Left 08/23/2021   Procedure: LEFT BREAST LUMPECTOMY WITH RADIOACTIVE SEED LOCALIZATION;  Surgeon: Jovita Kussmaul, MD;  Location: [facility];  Service: General;  Laterality: Left;   COLONOSCOPY     DILATION AND CURETTAGE OF [place]     for fibroids   UPPER GASTROINTESTINAL  ENDOSCOPY      There were no vitals filed for this visit.   Subjective Assessment - 10/11/21 0944     Subjective Pt reports no further incidences of knee getting "stuck" during stretches - no other issues with current HEP.    Pertinent History recent L breast surgery for DCIS (08/23/21), history benign brain tumor, OA L knee, osteopenia, DDD, GERD, hypothyroidism, small vessel disease.    Patient Stated Goals to improve balance if needed    Currently in Pain? No/denies                               [facility] Adult PT Treatment/Exercise - 10/11/21 0940       Lumbar Exercises: Standing   Row Both;10 reps;Strengthening;Theraband   2 sets   Theraband Level (Row) Level 2 (Red)    Row Limitations cues for TrA & scap retraction/depression to promote upright posture    Shoulder Extension Both;10 reps;Strengthening;Theraband   2 sets   Theraband Level (Shoulder Extension) Level 2 (Red)    Shoulder Extension Limitations cues for TrA &  scap retraction/depression to promote upright posture      Knee/Hip Exercises: Aerobic   Nustep L4 x 6 min (UE/LE)      Knee/Hip Exercises: Standing   Hip Flexion Both;10 reps;2 sets;Stengthening;Knee bent    Hip Flexion Limitations red TB march   UE support on back of chair   Hip Abduction Both;10 reps;2 sets;Stengthening;Knee straight    Abduction Limitations red TB at ankles - cues for upirght posture with slight hip extension for increased glute activation   UE support on back of chair   Hip Extension Both;10 reps;2 sets;Stengthening;Knee straight    Extension Limitations red TB at ankles - cues for upirght posture   UE support on back of chair                    PT Education - 10/11/21 1022     Education Details HEP update - postural strengthening progression - Access Code: [email or web address]    Person(s) Educated Patient    Methods Explanation;Demonstration;Verbal cues;Tactile cues;Handout    Comprehension Verbalized  understanding;Verbal cues required;Tactile cues required;Returned demonstration;Need further instruction              PT Short Term Goals - 09/07/21 1036       PT SHORT TERM GOAL #1   Title Pt. will complete FOTO for balance within 2 visits.    Status Achieved   09/07/21              PT Long Term Goals - 10/02/21 1109       PT LONG TERM GOAL #1   Title Pt. will be independent with advanced HEP for balance/posture.    Status On-going   10/02/21 - independent with initial HEP provided for lumbopelvic and LE flexibilty, but have not yet been able to address posture and balance due to extended absence from PT   Target Date 10/30/21      PT LONG TERM GOAL #2   Title Pt. will improve score on FGA by 6 points to decrease risk of falls.    Baseline 21/30 (08/28/21);  improved to 24/30 (10/02/21)    Status On-going    Target Date 10/30/21      PT LONG TERM GOAL #3   Title Pt. will be able to stand on L & R  single LE x 5 seconds to decrease risk of falls.    Baseline 0 seconds on L, 20 seconds on R (08/28/21); R = 3.41 sec, L = 13.65 sec (10/02/21)    Status Revised    Target Date 10/30/21      PT LONG TERM GOAL #4   Title Patient will be educated on fall prevention strategies.    Status Partially Met   10/02/21 - Falls safety checklist provided today   Target Date 10/30/21                   Plan - 10/11/21 1024     Clinical Impression Statement Kylie Taylor reports no current issues with HEP and no further instances of her knee getting stuck during stretches. Progressed TE today to include postural strengthening targeting core and proximal UE/LE exercises. Cues necessary for desired muscle activation for abdominal muscles with resisted scapular exercises as well as glutes with LE exercises but pt able to provide good return demonstration, therefore HEP updated to include new exercises.    Comorbidities recent L breast surgery for DCIS (08/23/21), history benign brain tumor,  OA L knee, osteopenia, DDD, GERD, hypothyroidism,  small vessel disease.    Rehab Potential Excellent    PT Frequency 1x / week    PT Duration 4 weeks    PT Treatment/Interventions ADLs/Self Care Home Management;Gait training;Stair training;Functional mobility training;Therapeutic activities;Therapeutic exercise;Balance training;Neuromuscular re-education;Patient/family education;Manual techniques;Joint Manipulations;Dry needling;Orthotic Fit/Training    PT Next Visit Plan progress therapeutic exercises & activities to include postural strengthening and balance - update HEP as indicated    PT Home Exercise Plan Access Code: [email or web address] (12/16, updated 1/10 & 1/19)    Consulted and Agree with Plan of Care Patient             Patient will benefit from skilled therapeutic intervention in order to improve the following deficits and impairments:  Abnormal gait, Decreased balance, Decreased safety awareness, Postural dysfunction, Decreased strength  Visit Diagnosis: Repeated falls  Gait difficulty  Abnormal posture     Problem List Patient Active Problem List   Diagnosis Date Noted   Genetic testing 08/13/2021   Family history of prostate cancer 07/26/2021   Family history of brain cancer 07/26/2021   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  [place], [place], [address] Phone: [phone]   Fax:  [phone]  Name: Kylie Taylor MRN: [id number] Date of Birth: 1941-03-07

## 2021-10-11 NOTE — Patient Instructions (Signed)
° ° °  Access Code: [email or web address] URL: [email or web address] Date: 10/11/2021 Prepared by: Annie Paras  Exercises Hooklying Hamstring Stretch with Strap - 2-3 x daily - 7 x weekly - 3 reps - 30 sec hold Modified Thomas Stretch - 2-3 x daily - 7 x weekly - 3 reps - 30 sec hold Supine Figure 4 Piriformis Stretch - 2-3 x daily - 7 x weekly - 3 reps - 30 sec hold Supine Piriformis Stretch with Foot on Ground - 2-3 x daily - 7 x weekly - 3 reps - 30 sec hold Supine Lower Trunk Rotation - 2-3 x daily - 7 x weekly - 5 reps - 10 sec hold Gastroc Stretch on Step - 2-3 x daily - 7 x weekly - 3 reps - 30 sec hold Seated Hamstring Stretch with Strap - 2-3 x daily - 7 x weekly - 3 reps - 30 sec hold Standing Hip Abduction with Resistance at Ankles and Counter Support - 1 x daily - 4-5 x weekly - 2 sets - 10 reps - 3 sec hold Standing Hip Extension with Resistance at Ankles and Counter Support - 1 x daily - 4-5 x weekly - 2 sets - 10 reps - 3 sec hold Standing March with Counter Support - 1 x daily - 4-5 x weekly - 2 sets - 10 reps - 2-3 sec hold Standing Bilateral Low Shoulder Row with Anchored Resistance - 1 x daily - 4-5 x weekly - 2 sets - 10 reps - 5 sec hold Scapular Retraction with Resistance Advanced - 1 x daily - 4-5 x weekly - 2 sets - 10 reps - 5 sec hold  Patient Education Check for Safety

## 2021-10-16 ENCOUNTER — Ambulatory Visit: Payer: Medicare Other | Admitting: Physical Therapy

## 2021-10-16 ENCOUNTER — Other Ambulatory Visit: Payer: Self-pay

## 2021-10-16 ENCOUNTER — Encounter: Payer: Self-pay | Admitting: Physical Therapy

## 2021-10-16 DIAGNOSIS — R269 Unspecified abnormalities of gait and mobility: Secondary | ICD-10-CM

## 2021-10-16 DIAGNOSIS — R293 Abnormal posture: Secondary | ICD-10-CM

## 2021-10-16 DIAGNOSIS — R296 Repeated falls: Secondary | ICD-10-CM

## 2021-10-16 NOTE — Therapy (Signed)
[facility] [address]  [place], [place], [address] Phone: [phone]   Fax:  [phone]  Physical Therapy Treatment  Patient Details  Name: Kylie Taylor MRN: [id number] Date of Birth: September 11, 1941 Referring Provider (PT): Darien Ramus, MD   Encounter Date: 10/16/2021   PT End of Session - 10/16/21 0941     Visit Number 5    Number of Visits 7    Date for PT Re-Evaluation 10/30/21    Authorization Type Medicare & Aetna    PT Start Time [phone]   Pt arrived late   PT Stop Time 43    PT Time Calculation (min) 38 min    Activity Tolerance Patient tolerated treatment well    Behavior During Therapy [facility] for tasks assessed/performed             Past Medical History:  Diagnosis Date   Allergy    seasonal   Angiomyolipoma    Anxiety    Asthma    Benign brain tumor ([place])    x3   Benign positional vertigo    Bilateral bunions    and pronated feet   Carpal tunnel syndrome    Cataract    DDD (degenerative disc disease)    Diverticulosis    2007 colon   Dysphagia    Food impaction of esophagus    multiple   GERD (gastroesophageal reflux disease)    Hiatal hernia    Hx of adenomatous polyp of colon 01/03/2016   Hyperlipemia    Hypertension    Hypothyroidism    Lumbar radiculopathy    recurrent since 1996   Multiple gastric polyps    Osteopenia    Rosacea    Small vessel disease, cerebrovascular    dx in 2012   Tick bites    2017- taking Doxycycline for this    Past Surgical History:  Procedure Laterality Date   BREAST LUMPECTOMY WITH RADIOACTIVE SEED LOCALIZATION Left 08/23/2021   Procedure: LEFT BREAST LUMPECTOMY WITH RADIOACTIVE SEED LOCALIZATION;  Surgeon: Jovita Kussmaul, MD;  Location: [facility];  Service: General;  Laterality: Left;   COLONOSCOPY     DILATION AND CURETTAGE OF [place]     for fibroids   UPPER GASTROINTESTINAL  ENDOSCOPY      There were no vitals filed for this visit.   Subjective Assessment - 10/16/21 0944     Subjective No issues today. Noted increased propensity for muscle cramping if she does not stretch before bed.    Pertinent History recent L breast surgery for DCIS (08/23/21), history benign brain tumor, OA L knee, osteopenia, DDD, GERD, hypothyroidism, small vessel disease.    Patient Stated Goals to improve balance if needed    Currently in Pain? No/denies                               [facility] Adult PT Treatment/Exercise - 10/16/21 0941       Knee/Hip Exercises: Aerobic   Nustep L4 x 6 min (UE/LE)                 Balance Exercises - 10/16/21 0941       Balance Exercises: Standing   Tandem Stance Eyes open;2 reps;30 secs    Wall Bumps Eyes opened;Hip;Shoulder;10 reps    Partial Tandem Stance Eyes open;Eyes closed;1 rep;15  secs   repeated with each foot in fwd position   Partial Tandem Stance Limitations eyes open with arms folded on chest + horiz & vertical head turns/nods x 5, upper trunk rotation x 5; eyes closed x 15 sec                PT Education - 10/16/21 1015     Education Details HEP update - corner balance progression    Person(s) Educated Patient    Methods Explanation;Demonstration;Verbal cues;Handout    Comprehension Verbalized understanding;Verbal cues required;Returned demonstration;Need further instruction              PT Short Term Goals - 09/07/21 1036       PT SHORT TERM GOAL #1   Title Pt. will complete FOTO for balance within 2 visits.    Status Achieved   09/07/21              PT Long Term Goals - 10/16/21 0948       PT LONG TERM GOAL #1   Title Pt. will be independent with advanced HEP for balance/posture.    Status On-going   10/16/21 - met for current HEP (strengthening and posture); balance activities progressed today   Target Date 10/30/21      PT LONG TERM GOAL #2   Title Pt. will improve  score on FGA by 6 points to decrease risk of falls.    Baseline 21/30 (08/28/21);  improved to 24/30 (10/02/21)    Status On-going    Target Date 10/30/21      PT LONG TERM GOAL #3   Title Pt. will be able to stand on L & R  single LE x 5 seconds to decrease risk of falls.    Baseline 0 seconds on L, 20 seconds on R (08/28/21); R = 3.41 sec, L = 13.65 sec (10/02/21)    Status On-going    Target Date 10/30/21      PT LONG TERM GOAL #4   Title Patient will be educated on fall prevention strategies.    Status Achieved   10/16/21 - pt reports no question regarding fall safety checklist   Target Date 10/30/21                   Plan - 10/16/21 0949     Clinical Impression Statement Kylie Taylor reports no issues or concerns with latest HEP update but does note that when she performs the LE exercises in the pool she has more ROM than when she performs them with the bands - explained buoyancy effect of the water and how this is normal. Todays activities focusing on balance with introduction of corner balance progression starting at partial tandem stance position based on stability with static stance. Explanation/instruction provided on continue progression of stance positions - HEP instructions provided with pt verbalizing good understanding.    Comorbidities recent L breast surgery for DCIS (08/23/21), history benign brain tumor, OA L knee, osteopenia, DDD, GERD, hypothyroidism, small vessel disease.    Rehab Potential Excellent    PT Frequency 1x / week    PT Duration 4 weeks    PT Treatment/Interventions ADLs/Self Care Home Management;Gait training;Stair training;Functional mobility training;Therapeutic activities;Therapeutic exercise;Balance training;Neuromuscular re-education;Patient/family education;Manual techniques;Joint Manipulations;Dry needling;Orthotic Fit/Training    PT Next Visit Plan progress therapeutic exercises & activities for postural strengthening and balance - update HEP as  indicated    PT Home Exercise Plan Access Code: [email or web address] (12/16, updated 1/10, 1/19 & 1/24)  Consulted and Agree with Plan of Care Patient             Patient will benefit from skilled therapeutic intervention in order to improve the following deficits and impairments:  Abnormal gait, Decreased balance, Decreased safety awareness, Postural dysfunction, Decreased strength  Visit Diagnosis: Repeated falls  Gait difficulty  Abnormal posture     Problem List Patient Active Problem List   Diagnosis Date Noted   Genetic testing 08/13/2021   Family history of prostate cancer 07/26/2021   Family history of brain cancer 07/26/2021   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  [place], [place], [address] Phone: [phone]   Fax:  [phone]  Name: Kylie Taylor MRN: [id number] Date of Birth: 1941/05/29

## 2021-10-16 NOTE — Patient Instructions (Signed)
° °  Access Code: [email or web address] URL: [email or web address] Date: 10/16/2021 Prepared by: Annie Paras  Exercises Hooklying Hamstring Stretch with Strap - 2-3 x daily - 7 x weekly - 3 reps - 30 sec hold Modified Thomas Stretch - 2-3 x daily - 7 x weekly - 3 reps - 30 sec hold Supine Figure 4 Piriformis Stretch - 2-3 x daily - 7 x weekly - 3 reps - 30 sec hold Supine Piriformis Stretch with Foot on Ground - 2-3 x daily - 7 x weekly - 3 reps - 30 sec hold Supine Lower Trunk Rotation - 2-3 x daily - 7 x weekly - 5 reps - 10 sec hold Gastroc Stretch on Step - 2-3 x daily - 7 x weekly - 3 reps - 30 sec hold Seated Hamstring Stretch with Strap - 2-3 x daily - 7 x weekly - 3 reps - 30 sec hold Standing Hip Abduction with Resistance at Ankles and Counter Support - 1 x daily - 4-5 x weekly - 2 sets - 10 reps - 3 sec hold Standing Hip Extension with Resistance at Ankles and Counter Support - 1 x daily - 4-5 x weekly - 2 sets - 10 reps - 3 sec hold Standing March with Counter Support - 1 x daily - 4-5 x weekly - 2 sets - 10 reps - 2-3 sec hold Standing Bilateral Low Shoulder Row with Anchored Resistance - 1 x daily - 4-5 x weekly - 2 sets - 10 reps - 5 sec hold Scapular Retraction with Resistance Advanced - 1 x daily - 4-5 x weekly - 2 sets - 10 reps - 5 sec hold Semi Tandem Corner Balance Eyes Open - 1 x daily - 7 x weekly  Patient Education Check for Safety

## 2021-10-23 ENCOUNTER — Other Ambulatory Visit: Payer: Self-pay

## 2021-10-23 ENCOUNTER — Ambulatory Visit: Payer: Medicare Other | Admitting: Physical Therapy

## 2021-10-23 ENCOUNTER — Encounter: Payer: Self-pay | Admitting: Physical Therapy

## 2021-10-23 DIAGNOSIS — R269 Unspecified abnormalities of gait and mobility: Secondary | ICD-10-CM

## 2021-10-23 DIAGNOSIS — R296 Repeated falls: Secondary | ICD-10-CM | POA: Diagnosis not present

## 2021-10-23 DIAGNOSIS — R293 Abnormal posture: Secondary | ICD-10-CM

## 2021-10-23 NOTE — Therapy (Signed)
[facility] [address]  [place], [place], [address] Phone: [phone]   Fax:  [phone]  Physical Therapy Treatment  Patient Details  Name: Kylie Taylor MRN: [id number] Date of Birth: October 14, 1940 Referring Provider (PT): Darien Ramus, MD   Encounter Date: 10/23/2021   PT End of Session - 10/23/21 1315     Visit Number 6    Number of Visits 7    Date for PT Re-Evaluation 10/30/21    Authorization Type Medicare & Aetna    PT Start Time 1315    PT Stop Time 1402    PT Time Calculation (min) 47 min    Activity Tolerance Patient tolerated treatment well    Behavior During Therapy [facility] for tasks assessed/performed             Past Medical History:  Diagnosis Date   Allergy    seasonal   Angiomyolipoma    Anxiety    Asthma    Benign brain tumor ([place])    x3   Benign positional vertigo    Bilateral bunions    and pronated feet   Carpal tunnel syndrome    Cataract    DDD (degenerative disc disease)    Diverticulosis    2007 colon   Dysphagia    Food impaction of esophagus    multiple   GERD (gastroesophageal reflux disease)    Hiatal hernia    Hx of adenomatous polyp of colon 01/03/2016   Hyperlipemia    Hypertension    Hypothyroidism    Lumbar radiculopathy    recurrent since 1996   Multiple gastric polyps    Osteopenia    Rosacea    Small vessel disease, cerebrovascular    dx in 2012   Tick bites    2017- taking Doxycycline for this    Past Surgical History:  Procedure Laterality Date   BREAST LUMPECTOMY WITH RADIOACTIVE SEED LOCALIZATION Left 08/23/2021   Procedure: LEFT BREAST LUMPECTOMY WITH RADIOACTIVE SEED LOCALIZATION;  Surgeon: Jovita Kussmaul, MD;  Location: [facility];  Service: General;  Laterality: Left;   COLONOSCOPY     DILATION AND CURETTAGE OF [place]     for fibroids   UPPER GASTROINTESTINAL ENDOSCOPY       There were no vitals filed for this visit.   Subjective Assessment - 10/23/21 1319     Subjective Pt reports she has been working in her HEP in little bits throughout her day.    Pertinent History recent L breast surgery for DCIS (08/23/21), history benign brain tumor, OA L knee, osteopenia, DDD, GERD, hypothyroidism, small vessel disease.    Patient Stated Goals to improve balance if needed    Currently in Pain? No/denies                               [facility] Adult PT Treatment/Exercise - 10/23/21 1315       Lumbar Exercises: Stretches   Hip Flexor Stretch Right;Left;2 reps;30 seconds    Hip Flexor Stretch Limitations seated lunge position over edge of chair      Lumbar Exercises: Standing   Other Standing Lumbar Exercises R/L RTB pallof press with narrow BOS x 10      Knee/Hip Exercises: Aerobic   Nustep L4 x 6 min (UE/LE)  PT Education - 10/23/21 1401     Education Details HEP review and update - Access Code: [email or web address]    Person(s) Educated Patient    Methods Explanation;Demonstration;Verbal cues;Handout    Comprehension Verbalized understanding;Verbal cues required;Returned demonstration;Need further instruction              PT Short Term Goals - 09/07/21 1036       PT SHORT TERM GOAL #1   Title Pt. will complete FOTO for balance within 2 visits.    Status Achieved   09/07/21              PT Long Term Goals - 10/16/21 0948       PT LONG TERM GOAL #1   Title Pt. will be independent with advanced HEP for balance/posture.    Status On-going   10/16/21 - met for current HEP (strengthening and posture); balance activities progressed today   Target Date 10/30/21      PT LONG TERM GOAL #2   Title Pt. will improve score on FGA by 6 points to decrease risk of falls.    Baseline 21/30 (08/28/21);  improved to 24/30 (10/02/21)    Status On-going    Target Date 10/30/21      PT LONG TERM GOAL #3   Title Pt.  will be able to stand on L & R  single LE x 5 seconds to decrease risk of falls.    Baseline 0 seconds on L, 20 seconds on R (08/28/21); R = 3.41 sec, L = 13.65 sec (10/02/21)    Status On-going    Target Date 10/30/21      PT LONG TERM GOAL #4   Title Patient will be educated on fall prevention strategies.    Status Achieved   10/16/21 - pt reports no question regarding fall safety checklist   Target Date 10/30/21                   Plan - 10/23/21 1402     Clinical Impression Statement Kylie Taylor reports she has been incorporating her HEP throughout her day, completing some exercises while she is in the pool to swim laps and mixing the remaining exercises/activities into her day in bits and pieces. She reports no concerns with any of the exercises but does admit that she does not complete the mod Thomas hip flexor stretch as often as she does not find herself in a convenient position to complete the stretch - provided instruction in seated alternative to allow for more convenience of performance and educated pt on how improving hip flexor extensibility will promote improved posture and stride length with gait. Pt denies other concerns related to strength, balance or mobility but does note concerns about stability when her puppy pulls unexpectedly on the lease, therefore added lateral core strengthening and stability with RTB pallof press. Kylie Taylor has one visit remain in her current POC - will plan for goal reassessment and anticipate transition to HEP unless testing results less that anticipated or pt expresses further concerns in need of PT attention.    Comorbidities recent L breast surgery for DCIS (08/23/21), history benign brain tumor, OA L knee, osteopenia, DDD, GERD, hypothyroidism, small vessel disease.    Rehab Potential Excellent    PT Frequency 1x / week    PT Duration 4 weeks    PT Treatment/Interventions ADLs/Self Care Home Management;Gait training;Stair training;Functional  mobility training;Therapeutic activities;Therapeutic exercise;Balance training;Neuromuscular re-education;Patient/family education;Manual techniques;Joint Manipulations;Dry needling;Orthotic Fit/Training    PT Next  Visit Plan goal assessment; anticipate transition to HEP +/- 30-day hold    PT Home Exercise Plan Access Code: [email or web address] (12/16, updated 1/10, 1/19, 1/24 & 1/31)    Consulted and Agree with Plan of Care Patient             Patient will benefit from skilled therapeutic intervention in order to improve the following deficits and impairments:  Abnormal gait, Decreased balance, Decreased safety awareness, Postural dysfunction, Decreased strength  Visit Diagnosis: Repeated falls  Gait difficulty  Abnormal posture     Problem List Patient Active Problem List   Diagnosis Date Noted   Genetic testing 08/13/2021   Family history of prostate cancer 07/26/2021   Family history of brain cancer 07/26/2021   Ductal carcinoma in situ (DCIS) of left breast 07/19/2021   Chest pain 03/27/2018   Bilateral primary osteoarthritis of knee 08/27/2016   Hx of adenomatous polyp of colon 01/03/2016   HYPOTHYROIDISM 02/17/2009   HYPERLIPIDEMIA 02/17/2009   ANXIETY 02/17/2009   GERD 02/17/2009   DIVERTICULOSIS OF COLON 02/17/2009   DEGENERATIVE [place] DISEASE 02/17/2009   DYSPHAGIA UNSPECIFIED 02/17/2009    Percival Spanish, PT 10/23/2021, 7:56 PM  [facility] [address]  [place], [place], [address] Phone: [phone]   Fax:  [phone]  Name: Hiroko Tregre MRN: [id number] Date of Birth: 1941/05/09

## 2021-10-23 NOTE — Patient Instructions (Signed)
° ° °  Access Code: [email or web address] URL: [email or web address] Date: 10/23/2021 Prepared by: Annie Paras  Exercises Hooklying Hamstring Stretch with Strap - 2-3 x daily - 7 x weekly - 3 reps - 30 sec hold Supine Figure 4 Piriformis Stretch - 2-3 x daily - 7 x weekly - 3 reps - 30 sec hold Supine Piriformis Stretch with Foot on Ground - 2-3 x daily - 7 x weekly - 3 reps - 30 sec hold Supine Lower Trunk Rotation - 2-3 x daily - 7 x weekly - 5 reps - 10 sec hold Gastroc Stretch on Step - 2-3 x daily - 7 x weekly - 3 reps - 30 sec hold Seated Hamstring Stretch with Strap - 2-3 x daily - 7 x weekly - 3 reps - 30 sec hold Standing Hip Abduction with Resistance at Ankles and Counter Support - 1 x daily - 4-5 x weekly - 2 sets - 10 reps - 3 sec hold Standing Hip Extension with Resistance at Ankles and Counter Support - 1 x daily - 4-5 x weekly - 2 sets - 10 reps - 3 sec hold Standing March with Counter Support - 1 x daily - 4-5 x weekly - 2 sets - 10 reps - 2-3 sec hold Standing Bilateral Low Shoulder Row with Anchored Resistance - 1 x daily - 4-5 x weekly - 2 sets - 10 reps - 5 sec hold Scapular Retraction with Resistance Advanced - 1 x daily - 4-5 x weekly - 2 sets - 10 reps - 5 sec hold Semi Tandem Corner Balance Eyes Open - 1 x daily - 7 x weekly Seated Hip Flexor Stretch - 2-3 x daily - 7 x weekly - 3 reps - 30 sec hold Standing Anti-Rotation Press with Anchored Resistance - 1 x daily - 4 x weekly - 2 sets - 10 reps - 3 sec hold  Patient Education Check for Safety

## 2021-10-30 ENCOUNTER — Encounter: Payer: Self-pay | Admitting: Physical Therapy

## 2021-10-30 ENCOUNTER — Ambulatory Visit: Payer: Medicare Other | Attending: Hematology and Oncology | Admitting: Physical Therapy

## 2021-10-30 ENCOUNTER — Other Ambulatory Visit: Payer: Self-pay

## 2021-10-30 DIAGNOSIS — R296 Repeated falls: Secondary | ICD-10-CM | POA: Diagnosis not present

## 2021-10-30 DIAGNOSIS — R293 Abnormal posture: Secondary | ICD-10-CM

## 2021-10-30 DIAGNOSIS — R269 Unspecified abnormalities of gait and mobility: Secondary | ICD-10-CM | POA: Diagnosis present

## 2021-10-30 NOTE — Therapy (Signed)
[facility] [address]  [place], [place], [address] Phone: [phone]   Fax:  [phone]  Physical Therapy Treatment / Discharge Summary  Patient Details  Name: Kylie Taylor MRN: [id number] Date of Birth: 1941/05/01 Referring Provider (PT): Darien Ramus, MD   Encounter Date: 10/30/2021   PT End of Session - 10/30/21 1323     Visit Number 7    Number of Visits 7    Date for PT Re-Evaluation 10/30/21    Authorization Type Medicare & Aetna    PT Start Time 4650   Pt arrived late   PT Stop Time 1401    PT Time Calculation (min) 38 min    Activity Tolerance Patient tolerated treatment well    Behavior During Therapy [facility] for tasks assessed/performed             Past Medical History:  Diagnosis Date   Allergy    seasonal   Angiomyolipoma    Anxiety    Asthma    Benign brain tumor ([place])    x3   Benign positional vertigo    Bilateral bunions    and pronated feet   Carpal tunnel syndrome    Cataract    DDD (degenerative disc disease)    Diverticulosis    2007 colon   Dysphagia    Food impaction of esophagus    multiple   GERD (gastroesophageal reflux disease)    Hiatal hernia    Hx of adenomatous polyp of colon 01/03/2016   Hyperlipemia    Hypertension    Hypothyroidism    Lumbar radiculopathy    recurrent since 1996   Multiple gastric polyps    Osteopenia    Rosacea    Small vessel disease, cerebrovascular    dx in 2012   Tick bites    2017- taking Doxycycline for this    Past Surgical History:  Procedure Laterality Date   BREAST LUMPECTOMY WITH RADIOACTIVE SEED LOCALIZATION Left 08/23/2021   Procedure: LEFT BREAST LUMPECTOMY WITH RADIOACTIVE SEED LOCALIZATION;  Surgeon: Jovita Kussmaul, MD;  Location: [facility];  Service: General;  Laterality: Left;   COLONOSCOPY     DILATION AND CURETTAGE OF [place]     for fibroids   UPPER  GASTROINTESTINAL ENDOSCOPY      There were no vitals filed for this visit.   Subjective Assessment - 10/30/21 1324     Subjective Pt reports she is doing well - feels looser in her hips than she did before. Feels that her balance is a little better - "more secure feeling" - probably due to the improvement in her posture and the improved flexibility in her hips    Pertinent History recent L breast surgery for DCIS (08/23/21), history benign brain tumor, OA L knee, osteopenia, DDD, GERD, hypothyroidism, small vessel disease.    Patient Stated Goals to improve balance if needed    Currently in Pain? No/denies                [facility] PT Assessment - 10/30/21 1323       Assessment   Medical Diagnosis H/o falling    Referring Provider (PT) Darien Ramus, MD    Onset Date/Surgical Date 08/23/21      Observation/Other Assessments   Focus on Therapeutic Outcomes (FOTO)  Balance = 60      Single Leg Stance   Comments  R = 7.13 sec, L = 5.85 sec      Strength   Right Hip Flexion 4+/5    Right Hip Extension 4+/5    Right Hip ABduction 5/5    Right Hip ADduction 5/5    Left Hip Flexion 4+/5    Left Hip Extension 5/5    Left Hip ABduction 5/5    Left Hip ADduction 5/5    Right Knee Flexion 5/5    Right Knee Extension 5/5    Left Knee Flexion 5/5    Left Knee Extension 5/5      Ambulation/Gait   Assistive device None    Gait Pattern Within Functional Limits    Gait velocity 4.34 ft/sec or 1.32 m/sec - essentially normal walking speed      Standardized Balance Assessment   10 Meter Walk 7.56 sec      Functional Gait  Assessment   Gait Level Surface Walks 20 ft in less than 5.5 sec, no assistive devices, good speed, no evidence for imbalance, normal gait pattern, deviates no more than 6 in outside of the 12 in walkway width.    Change in Gait Speed Able to smoothly change walking speed without loss of balance or gait deviation. Deviate no more than 6 in outside of the 12 in walkway  width.    Gait with Horizontal Head Turns Performs head turns smoothly with no change in gait. Deviates no more than 6 in outside 12 in walkway width    Gait with Vertical Head Turns Performs head turns with no change in gait. Deviates no more than 6 in outside 12 in walkway width.    Gait and Pivot Turn Pivot turns safely within 3 sec and stops quickly with no loss of balance.    Step Over Obstacle Is able to step over 2 stacked shoe boxes taped together (9 in total height) without changing gait speed. No evidence of imbalance.    Gait with Narrow Base of Support Is able to ambulate for 10 steps heel to toe with no staggering.    Gait with Eyes Closed Walks 20 ft, no assistive devices, good speed, no evidence of imbalance, normal gait pattern, deviates no more than 6 in outside 12 in walkway width. Ambulates 20 ft in less than 7 sec.    Ambulating Backwards Walks 20 ft, uses assistive device, slower speed, mild gait deviations, deviates 6-10 in outside 12 in walkway width.    Steps Alternating feet, must use rail.    Total Score 28    FGA comment: 25-28 = low risk fall                           [facility] Adult PT Treatment/Exercise - 10/30/21 1323       Knee/Hip Exercises: Aerobic   Nustep L4 x 6 min (UE/LE)                       PT Short Term Goals - 09/07/21 1036       PT SHORT TERM GOAL #1   Title Pt. will complete FOTO for balance within 2 visits.    Status Achieved   09/07/21              PT Long Term Goals - 10/30/21 1328       PT LONG TERM GOAL #1   Title Pt. will be independent with advanced HEP for balance/posture.  Status Achieved   10/30/21     PT LONG TERM GOAL #2   Title Pt. will improve score on FGA by 6 points to decrease risk of falls.    Baseline 21/30 (08/28/21);  improved to 24/30 (10/02/21);  FGA = 28/30 (10/30/21)    Status Achieved   10/30/21 - FGA = 28/30     PT LONG TERM GOAL #3   Title Pt. will be able to stand on L & R   single LE x 5 seconds to decrease risk of falls.    Baseline 0 seconds on L, 20 seconds on R (08/28/21);  R = 3.41 sec, L = 13.65 sec (10/02/21);  R = 7.13 sec, L = 5.85 sec (10/30/21)    Status Achieved   10/30/21     PT LONG TERM GOAL #4   Title Patient will be educated on fall prevention strategies.    Status Achieved   10/16/21 - pt reports no question regarding fall safety checklist                  Plan - 10/30/21 1401     Clinical Impression Statement Kylie Taylor reports she feels looser in her hips and feel more secure in her balance since start of PT. She has demonstrated excellent progress with improved overall LE flexibility and strength, improved posture and improved balance as evidenced by normal gait speed of 1.32 m/s and increased FGA score of 28/30 indicating a very low risk for falls. She feels confident with her HEP and has been able to work it into her daily routine which will likely encourage more long-term consistency with performance. All goals met and she feels ready to transition to her HEP, therefore will proceed with discharge from PT.    Comorbidities recent L breast surgery for DCIS (08/23/21), history benign brain tumor, OA L knee, osteopenia, DDD, GERD, hypothyroidism, small vessel disease.    PT Treatment/Interventions ADLs/Self Care Home Management;Gait training;Stair training;Functional mobility training;Therapeutic activities;Therapeutic exercise;Balance training;Neuromuscular re-education;Patient/family education;Manual techniques;Joint Manipulations;Dry needling;Orthotic Fit/Training    PT Next Visit Plan transition to HEP; discharge from PT    PT Home Exercise Plan Access Code: [email or web address] (12/16, updated 1/10, 1/19, 1/24 & 1/31)    Consulted and Agree with Plan of Care Patient             Patient will benefit from skilled therapeutic intervention in order to improve the following deficits and impairments:  Abnormal gait, Decreased balance, Decreased safety  awareness, Postural dysfunction, Decreased strength  Visit Diagnosis: Repeated falls  Gait difficulty  Abnormal posture     Problem List Patient Active Problem List   Diagnosis Date Noted   Genetic testing 08/13/2021   Family history of prostate cancer 07/26/2021   Family history of brain cancer 07/26/2021   Ductal carcinoma in situ (DCIS) of left breast 07/19/2021   Chest pain 03/27/2018   Bilateral primary osteoarthritis of knee 08/27/2016   Hx of adenomatous polyp of colon 01/03/2016   HYPOTHYROIDISM 02/17/2009   HYPERLIPIDEMIA 02/17/2009   ANXIETY 02/17/2009   GERD 02/17/2009   DIVERTICULOSIS OF COLON 02/17/2009   DEGENERATIVE [place] DISEASE 02/17/2009   DYSPHAGIA UNSPECIFIED 02/17/2009     PHYSICAL THERAPY DISCHARGE SUMMARY  Visits from Start of Care: 7  Current functional level related to goals / functional outcomes:   Refer to above clinical impression and goal assessment.   Remaining deficits:   As above.    Education / Equipment:   HEP   Patient agrees  to discharge. Patient goals were met. Patient is being discharged due to meeting the stated rehab goals.  Percival Spanish, PT 10/30/2021, 8:07 PM  [facility] [address]  [place], [place], [address] Phone: [phone]   Fax:  [phone]  Name: Kylie Taylor MRN: [id number] Date of Birth: November 03, 1940

## 2021-11-20 ENCOUNTER — Encounter (HOSPITAL_COMMUNITY): Payer: Self-pay

## 2021-11-29 ENCOUNTER — Telehealth: Payer: Self-pay | Admitting: *Deleted

## 2021-12-06 ENCOUNTER — Other Ambulatory Visit: Payer: Self-pay

## 2021-12-06 ENCOUNTER — Encounter: Payer: Self-pay | Admitting: Adult Health

## 2021-12-06 ENCOUNTER — Inpatient Hospital Stay: Payer: Medicare Other | Attending: Hematology and Oncology | Admitting: Adult Health

## 2021-12-06 ENCOUNTER — Encounter: Payer: Medicare Other | Admitting: Adult Health

## 2021-12-06 VITALS — BP 193/78 | HR 74 | Temp 97.4°F | Resp 18 | Ht 62.5 in | Wt 166.1 lb

## 2021-12-06 DIAGNOSIS — Z800 Family history of malignant neoplasm of digestive organs: Secondary | ICD-10-CM | POA: Diagnosis not present

## 2021-12-06 DIAGNOSIS — Z8042 Family history of malignant neoplasm of prostate: Secondary | ICD-10-CM | POA: Insufficient documentation

## 2021-12-06 DIAGNOSIS — Z803 Family history of malignant neoplasm of breast: Secondary | ICD-10-CM | POA: Diagnosis not present

## 2021-12-06 DIAGNOSIS — Z808 Family history of malignant neoplasm of other organs or systems: Secondary | ICD-10-CM | POA: Insufficient documentation

## 2021-12-06 DIAGNOSIS — D0512 Intraductal carcinoma in situ of left breast: Secondary | ICD-10-CM | POA: Diagnosis not present

## 2021-12-06 DIAGNOSIS — I10 Essential (primary) hypertension: Secondary | ICD-10-CM | POA: Diagnosis not present

## 2021-12-06 MED ORDER — TAMOXIFEN CITRATE 10 MG PO TABS: 5.0000 mg | ORAL_TABLET | Freq: Two times a day (BID) | ORAL | 3 refills | Status: DC

## 2021-12-06 NOTE — Progress Notes (Signed)
SURVIVORSHIP  VISIT: ? ?BRIEF ONCOLOGIC HISTORY:  ?Oncology History  ?Ductal carcinoma in situ (DCIS) of left breast  ?07/19/2021 Initial Diagnosis  ? Screening mammogram: indeterminate grouped calcifications in the left breast. Diagnostic mammogram and [place]: 1.5 cm fine linear calcifications in the left breast.  Biopsy revealed high-grade DCIS ER 100%, PR 0% ?  ?07/25/2021 Cancer Staging  ? Staging form: Breast, AJCC 8th Edition ?- Clinical stage from 07/25/2021: Stage 0 (cTis (DCIS), cN0, cM0, G3, ER+, PR-) - Signed by Nicholas Lose, MD on 07/25/2021 ?Stage prefix: Initial diagnosis ?Nuclear grade: G3 ?Histologic grading system: 3 grade system ? ?  ?07/25/2021 Genetic Testing  ? [facility] CancerNext-Expanded Panel was Negative. Report date is 08/07/2021. ? ?The CancerNext-Expanded gene panel offered by [facility] and includes sequencing, rearrangement, and RNA analysis for the following 77 genes: AIP, ALK, APC, ATM, AXIN2, BAP1, BARD1, BLM, BMPR1A, BRCA1, BRCA2, BRIP1, CDC73, CDH1, CDK4, CDKN1B, CDKN2A, CHEK2, CTNNA1, DICER1, FANCC, FH, FLCN, GALNT12, KIF1B, LZTR1, MAX, MEN1, MET, MLH1, MSH2, MSH3, MSH6, MUTYH, NBN, NF1, NF2, NTHL1, PALB2, PHOX2B, PMS2, POT1, PRKAR1A, PTCH1, PTEN, RAD51C, RAD51D, RB1, RECQL, RET, SDHA, SDHAF2, SDHB, SDHC, SDHD, SMAD4, SMARCA4, SMARCB1, SMARCE1, STK11, SUFU, TMEM127, TP53, TSC1, TSC2, VHL and XRCC2 (sequencing and deletion/duplication); EGFR, EGLN1, HOXB13, KIT, MITF, PDGFRA, POLD1, and POLE (sequencing only); EPCAM and GREM1 (deletion/duplication only).  ?  ?08/23/2021 Definitive Surgery  ? FINAL MICROSCOPIC DIAGNOSIS:  ? ?A. BREAST, LEFT, LUMPECTOMY:  ?- Focal ductal carcinoma in situ, high-grade, with calcifications  ?- Resection margins are negative for DCIS - closest is the posterior margin at 0.1 cm  ?- Biopsy site changes  ?- Fibrocystic change with calcifications  ?- See oncology table  ?  ?09/04/2021 -  Anti-estrogen oral therapy  ? Tamoxifen 5 mg ?  ? ? ?INTERVAL HISTORY:  ?[name] to review her survivorship care plan detailing her treatment course for breast cancer, as well as monitoring long-term side effects of that treatment, education regarding health maintenance, screening, and overall wellness and health promotion.    ? ?Overall, Ms. Kylie Taylor reports feeling quite well.  She is taking tamoxifen daily and tolerates it well.   ? ?REVIEW OF SYSTEMS:  ?Review of Systems  ?Constitutional:  Negative for appetite change, chills, fatigue, fever and unexpected weight change.  ?HENT:   Negative for hearing loss, lump/mass and trouble swallowing.   ?Eyes:  Negative for eye problems and icterus.  ?Respiratory:  Negative for chest tightness, cough and shortness of breath.   ?Cardiovascular:  Negative for chest pain, leg swelling and palpitations.  ?Gastrointestinal:  Negative for abdominal distention, abdominal pain, constipation, diarrhea, nausea and vomiting.  ?Endocrine: Negative for hot flashes.  ?Genitourinary:  Negative for difficulty urinating.   ?Musculoskeletal:  Negative for arthralgias.  ?Skin:  Negative for itching and rash.  ?Neurological:  Negative for dizziness, extremity weakness, headaches and numbness.  ?Hematological:  Negative for adenopathy. Does not bruise/bleed easily.  ?Psychiatric/Behavioral:  Negative for depression. The patient is not nervous/anxious.   ?Breast: Denies any new nodularity, masses, tenderness, nipple changes, or nipple discharge.  ? ? ? ? ?ONCOLOGY TREATMENT TEAM:  ?1. Surgeon:  Dr. Marlou Starks at [facility] ?2. Medical Oncologist: Dr. Lindi Adie  ?3. Radiation Oncologist: Dr. Lisbeth Renshaw ?  ? ?PAST MEDICAL/SURGICAL HISTORY:  ?Past Medical History:  ?Diagnosis Date  ? Allergy   ? seasonal  ? Angiomyolipoma   ? Anxiety   ? Asthma   ? 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 Benign  positional vertigo   ? Bilateral bunions   ? and pronated feet  ? Carpal tunnel syndrome   ? Cataract   ? DDD (degenerative disc disease)   ? Diverticulosis   ? 2007 colon  ? Dysphagia    ? Food impaction of esophagus   ? multiple  ? GERD (gastroesophageal reflux disease)   ? Hiatal hernia   ? Hx of adenomatous polyp of colon 01/03/2016  ? Hyperlipemia   ? Hypertension   ? Hypothyroidism   ? Lumbar radiculopathy   ? recurrent since 1996  ? Multiple gastric polyps   ? Osteopenia   ? Rosacea   ? Small vessel disease, cerebrovascular   ? dx in 2012  ? Tick bites   ? 2017- taking Doxycycline for this  ? ?Past Surgical History:  ?Procedure Laterality Date  ? BREAST LUMPECTOMY WITH RADIOACTIVE SEED LOCALIZATION Left 08/23/2021  ? Procedure: LEFT BREAST LUMPECTOMY WITH RADIOACTIVE SEED LOCALIZATION;  Surgeon: Jovita Kussmaul, MD;  Location: [facility];  Service: General;  Laterality: Left;  ? COLONOSCOPY    ? DILATION AND CURETTAGE OF UTERUS    ? MOUTH SURGERY    ? PARTIAL HYSTERECTOMY    ? for fibroids  ? UPPER GASTROINTESTINAL ENDOSCOPY    ? ? ? ?ALLERGIES:  ?Allergies  ?Allergen Reactions  ? Atorvastatin Calcium Other (See Comments)  ?  Abnormal liver function tests  ? Coffee Bean Extract [Coffea Arabica] Itching and Other (See Comments)  ?  Indigestion, Headache  ? Demerol [Meperidine Hcl] Nausea And Vomiting  ? Metronidazole Other (See Comments)  ?  Flagyl- mouth ulcers   ? Other   ?  All diuretics cause rash  ? Tobacco [Tobacco] Other (See Comments)  ?  Smoke- ?Cannot ride in car with those who smoke d/t sensitivity  ? Triamterene Rash  ? ? ? ?CURRENT MEDICATIONS:  ?Outpatient Encounter Medications as of 12/06/2021  ?Medication Sig  ? amLODipine (NORVASC) 5 MG tablet Take 5 mg by mouth daily.  ? BIOTIN PO Take 1 capsule by mouth daily.  ? Calcium Citrate-Vitamin D (CITRACAL + D PO) Take 1 tablet by mouth daily.   ? levothyroxine (SYNTHROID) 100 MCG tablet Take 100 mcg by mouth daily before breakfast.  ? losartan (COZAAR) 100 MG tablet Take 100 mg by mouth daily.  ? omeprazole (PRILOSEC) 20 MG capsule Take 20 mg by mouth daily.  ? rosuvastatin (CRESTOR) 20 MG tablet Take 20 mg by mouth  daily.  ? tamoxifen (NOLVADEX) 10 MG tablet Take 0.5 tablets (5 mg total) by mouth 2 (two) times daily.  ? VITAMIN D, CHOLECALCIFEROL, PO Take 2,000 Units by mouth daily.  ? albuterol (PROVENTIL HFA;VENTOLIN HFA) 108 (90 Base) MCG/ACT inhaler Inhale 2 puffs into the lungs every 6 (six) hours as needed for wheezing or shortness of breath. Reported on 12/11/2015 (Patient not taking: Reported on 12/06/2021)  ? HYDROcodone-acetaminophen (NORCO/VICODIN) 5-325 MG tablet Take 1 tablet by mouth every 6 (six) hours as needed for moderate pain or severe pain.  ? meloxicam (MOBIC) 7.5 MG tablet Take 7.5 mg by mouth as needed for pain.  (Patient not taking: Reported on 12/06/2021)  ? ?No facility-administered encounter medications on file as of 12/06/2021.  ? ? ? ?ONCOLOGIC FAMILY HISTORY:  ?Family History  ?Problem Relation Age of Onset  ? Alzheimer's disease Mother   ?     died at age 3 with this and pneumonia  ? Anemia Mother   ? Hypothyroidism Mother   ?  Hypertension Mother   ? Polymyalgia rheumatica Mother   ? Barrett's esophagus Father   ? Heart attack Father   ?     died with this age 59  ? Migraines Father   ? Hypertension Father   ? Peptic Ulcer Disease Father   ? Migraines Sister   ? Hypothyroidism Sister   ? Hypertension Sister   ? Sjogren's syndrome Sister   ? Other Sister   ?     cerebral hemorrhage and sepsis, died with these causes at 74  ? Migraines Brother   ? Other Brother   ?     prostate disease and degenerative spine disorder  ? Prostate cancer Brother 40  ? Brain cancer Paternal Aunt   ? Breast cancer Paternal Aunt 32  ? Thyroid cancer Paternal Aunt   ? Brain cancer Paternal Uncle 2  ? Prostate cancer Paternal Uncle 60  ? Brain cancer Maternal Grandmother 46  ? Prostate cancer Paternal Grandfather   ? Other Son   ?     degenerative disease of the lumbar spine  ? Pancreatic cancer Cousin   ? Colon cancer Neg Hx   ? Colon polyps Neg Hx   ? Rectal cancer Neg Hx   ? Stomach cancer Neg Hx   ? Esophageal cancer  Neg Hx   ? ? ? ?GENETIC COUNSELING/TESTING: ?See above ? ?SOCIAL HISTORY:  ?Social History  ? ?Socioeconomic History  ? Marital status: Divorced  ?  Spouse name: Not on file  ? Number of children: Not on f

## 2022-03-04 ENCOUNTER — Encounter: Payer: Self-pay | Admitting: Hematology and Oncology

## 2022-04-27 ENCOUNTER — Other Ambulatory Visit: Payer: Self-pay | Admitting: Hematology and Oncology

## 2022-04-27 DIAGNOSIS — D0512 Intraductal carcinoma in situ of left breast: Secondary | ICD-10-CM

## 2022-05-28 ENCOUNTER — Telehealth: Payer: Self-pay | Admitting: Hematology and Oncology

## 2022-05-28 NOTE — Telephone Encounter (Signed)
Scheduled appointment per provider PAL. Patient is aware of the changes made to her upcoming appointment.  

## 2022-06-07 ENCOUNTER — Ambulatory Visit: Payer: Medicare Other | Admitting: Hematology and Oncology

## 2022-06-17 NOTE — Progress Notes (Signed)
Patient Care Team: Derinda Late, MD as PCP - General (Family Medicine) Jovita Kussmaul, MD as Consulting Physician (General Surgery) Nicholas Lose, MD as Consulting Physician (Hematology and Oncology) Kyung Rudd, MD as Consulting Physician (Radiation Oncology)  DIAGNOSIS:  Encounter Diagnosis  Name Primary?   Ductal carcinoma in situ (DCIS) of left breast     SUMMARY OF ONCOLOGIC HISTORY: Oncology History  Ductal carcinoma in situ (DCIS) of left breast  07/19/2021 Initial Diagnosis   Screening mammogram: indeterminate grouped calcifications in the left breast. Diagnostic mammogram and [place]: 1.5 cm fine linear calcifications in the left breast.  Biopsy revealed high-grade DCIS ER 100%, PR 0%   07/25/2021 Cancer Staging   Staging form: Breast, AJCC 8th Edition - Clinical stage from 07/25/2021: Stage 0 (cTis (DCIS), cN0, cM0, G3, ER+, PR-) - Signed by Nicholas Lose, MD on 07/25/2021 Stage prefix: Initial diagnosis Nuclear grade: G3 Histologic grading system: 3 grade system   07/25/2021 Genetic Testing   [facility] CancerNext-Expanded Panel was Negative. Report date is 08/07/2021.  The CancerNext-Expanded gene panel offered by [facility] and includes sequencing, rearrangement, and RNA analysis for the following 77 genes: AIP, ALK, APC, ATM, AXIN2, BAP1, BARD1, BLM, BMPR1A, BRCA1, BRCA2, BRIP1, CDC73, CDH1, CDK4, CDKN1B, CDKN2A, CHEK2, CTNNA1, DICER1, FANCC, FH, FLCN, GALNT12, KIF1B, LZTR1, MAX, MEN1, MET, MLH1, MSH2, MSH3, MSH6, MUTYH, NBN, NF1, NF2, NTHL1, PALB2, PHOX2B, PMS2, POT1, PRKAR1A, PTCH1, PTEN, RAD51C, RAD51D, RB1, RECQL, RET, SDHA, SDHAF2, SDHB, SDHC, SDHD, SMAD4, SMARCA4, SMARCB1, SMARCE1, STK11, SUFU, TMEM127, TP53, TSC1, TSC2, VHL and XRCC2 (sequencing and deletion/duplication); EGFR, EGLN1, HOXB13, KIT, MITF, PDGFRA, POLD1, and POLE (sequencing only); EPCAM and GREM1 (deletion/duplication only).    08/23/2021 Definitive Surgery   FINAL MICROSCOPIC DIAGNOSIS:   A. BREAST,  LEFT, LUMPECTOMY:  - Focal ductal carcinoma in situ, high-grade, with calcifications  - Resection margins are negative for DCIS - closest is the posterior margin at 0.1 cm  - Biopsy site changes  - Fibrocystic change with calcifications  - See oncology table    09/04/2021 -  Anti-estrogen oral therapy   Tamoxifen 5 mg     CHIEF COMPLIANT: Follow-up of left breast cancer on Tamoxifen    INTERVAL HISTORY: Kylie Taylor is a 81 y.o. with above-mentioned history of left breast cancer. She presents to the clinic for a follow-up. She had some concerns about the tamoxifen affecting her cataracts arteritis and hearing. She states that she is having trouble with cramps. Arteritis is mainly in feet.Denies hot flashes. Her main concern is if it was anything she could do for her cramps, they are mainly in her calves.    ALLERGIES:  is allergic to atorvastatin calcium, coffee bean extract [coffea arabica], demerol [meperidine hcl], metronidazole, other, tobacco [tobacco], hydrochlorothiazide, and triamterene.  MEDICATIONS:  Current Outpatient Medications  Medication Sig Dispense Refill   albuterol (PROVENTIL HFA;VENTOLIN HFA) 108 (90 Base) MCG/ACT inhaler Inhale 2 puffs into the lungs every 6 (six) hours as needed for wheezing or shortness of breath. Reported on 12/11/2015 (Patient not taking: Reported on 12/06/2021)     amLODipine (NORVASC) 5 MG tablet Take 5 mg by mouth daily.     BIOTIN PO Take 1 capsule by mouth daily.     Calcium Citrate-Vitamin D (CITRACAL + D PO) Take 1 tablet by mouth daily.      levothyroxine (SYNTHROID) 100 MCG tablet Take 100 mcg by mouth daily before breakfast.     losartan (COZAAR) 100 MG tablet Take 100 mg by mouth daily.  meloxicam (MOBIC) 7.5 MG tablet Take 7.5 mg by mouth as needed for pain.  (Patient not taking: Reported on 12/06/2021)     omeprazole (PRILOSEC) 20 MG capsule Take 20 mg by mouth daily.     rosuvastatin (CRESTOR) 20 MG tablet Take 20 mg by mouth  daily.     tamoxifen (NOLVADEX) 10 MG tablet Take 1 tablet (10 mg total) by mouth daily. 90 tablet 3   VITAMIN D, CHOLECALCIFEROL, PO Take 2,000 Units by mouth daily.     No current facility-administered medications for this visit.    PHYSICAL EXAMINATION: ECOG PERFORMANCE STATUS: 1 - Symptomatic but completely ambulatory  Vitals:   06/21/22 0826  BP: (!) 156/69  Pulse: 66  Resp: 16  Temp: (!) 97.3 F (36.3 C)  SpO2: 100%   Filed Weights   06/21/22 0826  Weight: 166 lb 6.4 oz (75.5 kg)    BREAST: No palpable masses or nodules in either right or left breasts. No palpable axillary supraclavicular or infraclavicular adenopathy no breast tenderness or nipple discharge. (exam performed in the presence of a chaperone)  LABORATORY DATA:  I have reviewed the data as listed    Latest Ref Rng & Units 07/25/2021   12:17 PM 03/27/2018    6:14 AM 02/03/2011    9:30 AM  CMP  Glucose 70 - 99 mg/dL 105  101  95   BUN 8 - 23 mg/dL 22  23  20    Creatinine 0.44 - 1.00 mg/dL 1.19  1.09  0.80   Sodium 135 - 145 mmol/L 140  139  144   Potassium 3.5 - 5.1 mmol/L 3.8  4.0  3.3   Chloride 98 - 111 mmol/L 108  109  104   CO2 22 - 32 mmol/L 23  22  26    Calcium 8.9 - 10.3 mg/dL 8.9  8.4  10.2   Total Protein 6.5 - 8.1 g/dL 6.4  6.2    Total Bilirubin 0.3 - 1.2 mg/dL 1.0  0.4    Alkaline Phos 38 - 126 U/L 55  75    AST 15 - 41 U/L 19  25    ALT 0 - 44 U/L 15  23      Lab Results  Component Value Date   WBC 4.3 07/25/2021   HGB 13.7 07/25/2021   HCT 39.4 07/25/2021   MCV 88.7 07/25/2021   PLT 166 07/25/2021   NEUTROABS 2.8 07/25/2021    ASSESSMENT & PLAN:  Ductal carcinoma in situ (DCIS) of left breast Screening mammogram: indeterminate grouped calcifications in the left breast. Diagnostic mammogram and [place]: 1.5 cm fine linear calcifications in the left breast.  Biopsy revealed high-grade DCIS ER 100%, PR 0%   Recommendation: 1. Breast conserving surgery 08/23/2021:Left lumpectomy: Focal  DCIS high-grade with calcifications, margins negative, ER 100%, PR 0% 2. +/- adjuvant radiation therapy (patient does not want to do radiation) 3. Followed by antiestrogen therapy with tamoxifen 5 years (5 mg daily based upon recent clinical trials showing equivalent results at this dosage) --------------------------------------------------------------------------------------------------------------------------- Tamoxifen Toxicities: Tolerating it well without any problems or concerns.  Denies any hot flashes.  She does have chronic osteoarthritis especially in the knees.  Breast Cancer Surveillance: 1. Breast Exam: 06/21/22: Benign 2. Mammograms: 03/04/22: Benign  RTC in 1 year    No orders of the defined types were placed in this encounter.  The patient has a good understanding of the overall plan. she agrees with it. she will call with any problems  that may develop before the next visit here. Total time spent: 30 mins including face to face time and time spent for planning, charting and co-ordination of care   Harriette Ohara, MD 06/21/22    I Gardiner Coins am scribing for Dr. Lindi Adie  I have reviewed the above documentation for accuracy and completeness, and I agree with the above.

## 2022-06-21 ENCOUNTER — Inpatient Hospital Stay: Payer: Medicare Other | Attending: Hematology and Oncology | Admitting: Hematology and Oncology

## 2022-06-21 DIAGNOSIS — Z79810 Long term (current) use of selective estrogen receptor modulators (SERMs): Secondary | ICD-10-CM | POA: Diagnosis not present

## 2022-06-21 DIAGNOSIS — Z86000 Personal history of in-situ neoplasm of breast: Secondary | ICD-10-CM | POA: Insufficient documentation

## 2022-06-21 DIAGNOSIS — D0512 Intraductal carcinoma in situ of left breast: Secondary | ICD-10-CM | POA: Diagnosis not present

## 2022-06-21 DIAGNOSIS — M170 Bilateral primary osteoarthritis of knee: Secondary | ICD-10-CM | POA: Diagnosis not present

## 2022-06-21 MED ORDER — TAMOXIFEN CITRATE 10 MG PO TABS: 10.0000 mg | ORAL_TABLET | Freq: Every day | ORAL | 3 refills | Status: DC

## 2022-06-21 NOTE — Assessment & Plan Note (Addendum)
Screening mammogram: indeterminate grouped calcifications in the left breast. Diagnostic mammogram and [place]: 1.5 cm fine linear calcifications in the left breast. Biopsy revealed high-grade DCIS ER 100%, PR 0%  Recommendation: 1. Breast conserving surgery 08/23/2021:Left lumpectomy: Focal DCIS high-grade with calcifications, margins negative, ER 100%, PR 0% 2.+/-adjuvant radiation therapy (patient does not want to do radiation) 3. Followed by antiestrogen therapy with tamoxifen 5 years (5 mg daily based upon recent clinical trials showing equivalent results at this dosage) --------------------------------------------------------------------------------------------------------------------------- Tamoxifen Toxicities: Tolerating it well without any problems or concerns.  Denies any hot flashes.  She does have chronic osteoarthritis especially in the knees.  Breast Cancer Surveillance: 1. Breast Exam: 06/21/22: Benign 2. Mammograms: 03/04/22: Benign  RTC in 1 year

## 2022-06-25 ENCOUNTER — Other Ambulatory Visit: Payer: Self-pay | Admitting: Adult Health

## 2022-06-25 DIAGNOSIS — D0512 Intraductal carcinoma in situ of left breast: Secondary | ICD-10-CM

## 2022-10-01 ENCOUNTER — Ambulatory Visit (INDEPENDENT_AMBULATORY_CARE_PROVIDER_SITE_OTHER): Payer: Medicare Other

## 2022-10-01 ENCOUNTER — Ambulatory Visit (INDEPENDENT_AMBULATORY_CARE_PROVIDER_SITE_OTHER): Payer: Medicare Other | Admitting: Family

## 2022-10-01 DIAGNOSIS — G8929 Other chronic pain: Secondary | ICD-10-CM

## 2022-10-01 DIAGNOSIS — M1712 Unilateral primary osteoarthritis, left knee: Secondary | ICD-10-CM | POA: Diagnosis not present

## 2022-10-01 DIAGNOSIS — M25562 Pain in left knee: Secondary | ICD-10-CM | POA: Diagnosis not present

## 2022-10-02 ENCOUNTER — Encounter: Payer: Self-pay | Admitting: Family

## 2022-10-02 NOTE — Progress Notes (Signed)
Office Visit Note   Patient: Kylie Taylor           Date of Birth: 1941/01/05           MRN: [id number] Visit Date: 10/01/2022              Requested by: Derinda Late, MD [address] [place],  [place] [address] PCP: Derinda Late, MD  Chief Complaint  Patient presents with   Left Knee - Pain      HPI: The patient is an 82 year old woman who is seen today for chronic left knee pain this waxes and wanes she is complaining of primarily pain over the lateral compartment.  Known osteoarthritis she last had a cortisone injection in July 2022 this did not provide more than a few days of relief.  She complains of significant pain and difficulty descending inclines and stairs she must get up the stairs backwards.  Complains of start up stiffness  [facility] [employer] and [place] however the pain sometimes limits her activities she is interested in delaying total knee arthroplasty as long as possible would like to consider supplemental injection  Assessment & Plan: Visit Diagnoses:  1. Chronic pain of left knee     Plan: Will begin prior authorization for supplemental injection of the left knee she may come in for this at her convenience  Follow-Up Instructions: Return if symptoms worsen or fail to improve.   Left Knee Exam   Muscle Strength  The patient has normal left knee strength.  Tenderness  The patient is experiencing tenderness in the lateral joint line and medial joint line.  Range of Motion  The patient has normal left knee ROM.  Tests  Varus: negative Valgus: negative  Other  Erythema: absent Effusion: no effusion present      Patient is alert, oriented, no adenopathy, well-dressed, normal affect, normal respiratory effort.   Imaging: XR Knee 1-2 Views Left  Result Date: 10/02/2022 Radiographs of the left knee show moderate to severe degenerative changes with osteophytic spurring laterally  No images are attached  to the encounter.  Labs: No results found for: "HGBA1C", "ESRSEDRATE", "CRP", "LABURIC", "REPTSTATUS", "GRAMSTAIN", "CULT", "LABORGA"   Lab Results  Component Value Date   ALBUMIN 3.9 07/25/2021   ALBUMIN 3.7 03/27/2018    No results found for: "MG" No results found for: "VD25OH"  No results found for: "PREALBUMIN"    Latest Ref Rng & Units 07/25/2021   12:17 PM 03/27/2018    6:14 AM 02/03/2011    9:30 AM  CBC EXTENDED  WBC 4.0 - 10.5 K/uL 4.3  5.4  6.6   RBC 3.87 - 5.11 MIL/uL 4.44  4.73  5.22   Hemoglobin 12.0 - 15.0 g/dL 13.7  14.7  15.8   HCT 36.0 - 46.0 % 39.4  42.7  45.7   Platelets 150 - 400 K/uL 166  151  183   NEUT# 1.7 - 7.7 K/uL 2.8  2.1  3.8   Lymph# 0.7 - 4.0 K/uL 0.8  2.3  2.0      There is no height or weight on file to calculate BMI.  Orders:  Orders Placed This Encounter  Procedures   XR Knee 1-2 Views Left   No orders of the defined types were placed in this encounter.    Procedures: No procedures performed  Clinical Data: No additional findings.  ROS:  All other systems negative, except as noted in the  HPI. Review of Systems  Objective: Vital Signs: There were no vitals taken for this visit.  Specialty Comments:  No specialty comments available.  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   Chest pain 03/27/2018   Bilateral primary osteoarthritis of knee 08/27/2016   Hx of adenomatous polyp of colon 01/03/2016   HYPOTHYROIDISM 02/17/2009   HYPERLIPIDEMIA 02/17/2009   ANXIETY 02/17/2009   GERD 02/17/2009   DIVERTICULOSIS OF COLON 02/17/2009   DEGENERATIVE [place] DISEASE 02/17/2009   DYSPHAGIA UNSPECIFIED 02/17/2009   Past Medical History:  Diagnosis Date   Allergy    seasonal   Angiomyolipoma    Anxiety    Asthma    Benign  brain tumor ([place])    x3   Benign positional vertigo    Bilateral bunions    and pronated feet   Carpal tunnel syndrome    Cataract    DDD (degenerative disc disease)    Diverticulosis    2007 colon   Dysphagia    Food impaction of esophagus    multiple   GERD (gastroesophageal reflux disease)    Hiatal hernia    Hx of adenomatous polyp of colon 01/03/2016   Hyperlipemia    Hypertension    Hypothyroidism    Lumbar radiculopathy    recurrent since 1996   Multiple gastric polyps    Osteopenia    Rosacea    Small vessel disease, cerebrovascular    dx in 2012   Tick bites    2017- taking Doxycycline for this    Family History  Problem Relation Age of Onset   Alzheimer's disease Mother        died at age 90 with this and pneumonia   Anemia Mother    Hypothyroidism Mother    Hypertension Mother    Polymyalgia rheumatica Mother    Barrett's esophagus Father    Heart attack Father        died with this age 50   Migraines Father    Hypertension Father    Peptic Ulcer Disease Father    Migraines Sister    Hypothyroidism Sister    Hypertension Sister    Sjogren's syndrome Sister    Other Sister        cerebral hemorrhage and sepsis, died with these causes at 69   Migraines Brother    Other Brother        prostate disease and degenerative spine disorder   Prostate cancer Brother 71   Brain cancer Paternal Aunt    Breast cancer Paternal Aunt 58   Thyroid cancer Paternal 72    Brain cancer Paternal Uncle 30   Prostate cancer Paternal Uncle 68   Brain cancer Maternal Grandmother 73   Prostate cancer Paternal Grandfather    Other Son        degenerative disease of the lumbar spine   Pancreatic cancer Cousin    Colon cancer Neg Hx    Colon polyps Neg Hx    Rectal cancer Neg Hx    Stomach cancer Neg Hx    Esophageal cancer Neg Hx     Past Surgical History:  Procedure Laterality Date   BREAST LUMPECTOMY WITH RADIOACTIVE SEED LOCALIZATION Left 08/23/2021    Procedure: LEFT BREAST LUMPECTOMY WITH RADIOACTIVE SEED LOCALIZATION;  Surgeon: Marlou Starks,  Sena Hitch, MD;  Location: [facility];  Service: General;  Laterality: Left;   COLONOSCOPY     DILATION AND CURETTAGE OF UTERUS     MOUTH SURGERY     PARTIAL HYSTERECTOMY     for fibroids   UPPER GASTROINTESTINAL ENDOSCOPY     Social History   Occupational History   Occupation: retired  Tobacco Use   Smoking status: Never   Smokeless tobacco: Never  Vaping Use   Vaping Use: Never used  Substance and Sexual Activity   Alcohol use: Yes    Alcohol/week: 0.0 standard drinks of alcohol    Comment: occasional   Drug use: No   Sexual activity: Not on file

## 2022-10-03 ENCOUNTER — Telehealth: Payer: Self-pay

## 2022-10-03 NOTE — Telephone Encounter (Signed)
VOB submitted for Monovisc, left knee 

## 2022-10-03 NOTE — Telephone Encounter (Signed)
-----   Message from Suzan Slick, NP sent at 10/01/2022 10:56 AM EST ----- Could we work on supp injection left knee

## 2022-10-23 ENCOUNTER — Other Ambulatory Visit: Payer: Self-pay

## 2022-10-23 DIAGNOSIS — M1712 Unilateral primary osteoarthritis, left knee: Secondary | ICD-10-CM

## 2022-10-28 ENCOUNTER — Encounter: Payer: Self-pay | Admitting: Orthopedic Surgery

## 2022-10-28 ENCOUNTER — Ambulatory Visit (INDEPENDENT_AMBULATORY_CARE_PROVIDER_SITE_OTHER): Payer: Medicare Other | Admitting: Orthopedic Surgery

## 2022-10-28 DIAGNOSIS — M1712 Unilateral primary osteoarthritis, left knee: Secondary | ICD-10-CM

## 2022-10-28 MED ORDER — LIDOCAINE HCL (PF) 1 % IJ SOLN
2.0000 mL | INTRAMUSCULAR | Status: AC | PRN
  Administered 2022-10-28: 2 mL

## 2022-10-28 MED ORDER — HYALURONAN 88 MG/4ML IX SOSY
88.0000 mg | PREFILLED_SYRINGE | INTRA_ARTICULAR | Status: AC | PRN
  Administered 2022-10-28: 88 mg via INTRA_ARTICULAR

## 2022-10-28 NOTE — Progress Notes (Signed)
Office Visit Note   Patient: Kylie Taylor           Date of Birth: 1941/04/05           MRN: [id number] Visit Date: 10/28/2022              Requested by: Derinda Late, MD [address] [place],  [place] [address] PCP: Derinda Late, MD  Chief Complaint  Patient presents with   Left Knee - Follow-up    Monovisc injection      HPI: Patient is a 82 year old woman who presents in follow-up for osteoarthritis left knee.  Patient states that her first steroid injection helped a lot she had decreased benefits from her second steroid injection.  Patient complains of pain left knee with activities of daily living.  Assessment & Plan: Visit Diagnoses:  1. Primary osteoarthritis of left knee     Plan: Patient underwent Monovisc injection left knee.  Recommended that she continue with strength training to minimize risk of falls and to improve function of her knee.  Recommend using a cane.  Follow-Up Instructions: Return if symptoms worsen or fail to improve.   Ortho Exam  Patient is alert, oriented, no adenopathy, well-dressed, normal affect, normal respiratory effort. Examination patient has varicose veins around the left knee there is no redness or cellulitis no effusion.  She has crepitation with range of motion.  Imaging: No results found. No images are attached to the encounter.  Labs: No results found for: "HGBA1C", "ESRSEDRATE", "CRP", "LABURIC", "REPTSTATUS", "GRAMSTAIN", "CULT", "LABORGA"   Lab Results  Component Value Date   ALBUMIN 3.9 07/25/2021   ALBUMIN 3.7 03/27/2018    No results found for: "MG" No results found for: "VD25OH"  No results found for: "PREALBUMIN"    Latest Ref Rng & Units 07/25/2021   12:17 PM 03/27/2018    6:14 AM 02/03/2011    9:30 AM  CBC EXTENDED  WBC 4.0 - 10.5 K/uL 4.3  5.4  6.6   RBC 3.87 - 5.11 MIL/uL 4.44  4.73  5.22   Hemoglobin 12.0 - 15.0 g/dL 13.7  14.7  15.8   HCT 36.0 - 46.0 % 39.4  42.7  45.7   Platelets 150 -  400 K/uL 166  151  183   NEUT# 1.7 - 7.7 K/uL 2.8  2.1  3.8   Lymph# 0.7 - 4.0 K/uL 0.8  2.3  2.0      There is no height or weight on file to calculate BMI.  Orders:  No orders of the defined types were placed in this encounter.  No orders of the defined types were placed in this encounter.    Procedures: Large Joint Inj: L knee on 10/28/2022 3:56 PM Indications: pain and diagnostic evaluation Details: 22 G 1.5 in needle, anteromedial approach  Arthrogram: No  Medications: 2 mL lidocaine (PF) 1 %; 88 mg Hyaluronan 88 MG/4ML Outcome: tolerated well, no immediate complications Procedure, treatment alternatives, risks and benefits explained, specific risks discussed. Consent was given by the patient. Immediately prior to procedure a time out was called to verify the correct patient, procedure, equipment, support staff and site/side marked as required. Patient was prepped and draped in the usual sterile fashion.      Clinical Data: No additional findings.  ROS:  All other systems negative, except as noted in the HPI. Review of Systems  Objective: Vital Signs: There were no vitals taken for this visit.  Specialty Comments:  No specialty comments available.  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   Chest pain 03/27/2018   Bilateral primary osteoarthritis of knee 08/27/2016   Hx of adenomatous polyp of colon 01/03/2016   HYPOTHYROIDISM 02/17/2009   HYPERLIPIDEMIA 02/17/2009   ANXIETY 02/17/2009   GERD 02/17/2009   DIVERTICULOSIS OF COLON 02/17/2009   DEGENERATIVE [place] DISEASE 02/17/2009   DYSPHAGIA UNSPECIFIED 02/17/2009   Past Medical History:  Diagnosis Date   Allergy    seasonal   Angiomyolipoma    Anxiety    Asthma    Benign brain tumor ([facility])    x3    Benign positional vertigo    Bilateral bunions    and pronated feet   Carpal tunnel syndrome    Cataract    DDD (degenerative disc disease)    Diverticulosis    2007 colon   Dysphagia    Food impaction of esophagus    multiple   GERD (gastroesophageal reflux disease)    Hiatal hernia    Hx of adenomatous polyp of colon 01/03/2016   Hyperlipemia    Hypertension    Hypothyroidism    Lumbar radiculopathy    recurrent since 1996   Multiple gastric polyps    Osteopenia    Rosacea    Small vessel disease, cerebrovascular    dx in 2012   Tick bites    2017- taking Doxycycline for this    Family History  Problem Relation Age of Onset   Alzheimer's disease Mother        died at age 54 with this and pneumonia   Anemia Mother    Hypothyroidism Mother    Hypertension Mother    Polymyalgia rheumatica Mother    Barrett's esophagus Father    Heart attack Father        died with this age 32   Migraines Father    Hypertension Father    Peptic Ulcer Disease Father    Migraines Sister    Hypothyroidism Sister    Hypertension Sister    Sjogren's syndrome Sister    Other Sister        cerebral hemorrhage and sepsis, died with these causes at 77   Migraines Brother    Other Brother        prostate disease and degenerative spine disorder   Prostate cancer Brother 15   Brain cancer Paternal Aunt    Breast cancer Paternal Aunt 24   Thyroid cancer Paternal 45    Brain cancer Paternal Uncle 70   Prostate cancer Paternal Uncle 35   Brain cancer Maternal Grandmother 2   Prostate cancer Paternal Grandfather    Other Son        degenerative disease of the lumbar spine   Pancreatic cancer Cousin    Colon cancer Neg Hx    Colon polyps Neg Hx    Rectal cancer Neg Hx    Stomach cancer Neg Hx    Esophageal cancer Neg Hx     Past Surgical History:  Procedure Laterality Date   BREAST LUMPECTOMY WITH RADIOACTIVE SEED LOCALIZATION Left 08/23/2021   Procedure: LEFT BREAST LUMPECTOMY  WITH RADIOACTIVE SEED LOCALIZATION;  Surgeon: Jovita Kussmaul, MD;  Location: [facility];  Service: General;  Laterality: Left;   COLONOSCOPY     DILATION AND CURETTAGE  OF UTERUS     MOUTH SURGERY     PARTIAL HYSTERECTOMY     for fibroids   UPPER GASTROINTESTINAL ENDOSCOPY     Social History   Occupational History   Occupation: retired  Tobacco Use   Smoking status: Never   Smokeless tobacco: Never  Vaping Use   Vaping Use: Never used  Substance and Sexual Activity   Alcohol use: Yes    Alcohol/week: 0.0 standard drinks of alcohol    Comment: occasional   Drug use: No   Sexual activity: Not on file

## 2022-12-16 ENCOUNTER — Other Ambulatory Visit (HOSPITAL_COMMUNITY): Payer: Self-pay | Admitting: Family Medicine

## 2022-12-16 DIAGNOSIS — R011 Cardiac murmur, unspecified: Secondary | ICD-10-CM

## 2023-01-09 ENCOUNTER — Ambulatory Visit (HOSPITAL_COMMUNITY): Payer: Medicare Other | Attending: Cardiology

## 2023-01-09 DIAGNOSIS — R011 Cardiac murmur, unspecified: Secondary | ICD-10-CM | POA: Diagnosis present

## 2023-01-10 LAB — ECHOCARDIOGRAM COMPLETE
Area-P 1/2: 4.19 cm2
MV M vel: 4 m/s
MV Peak grad: 64 mmHg
Radius: 0.6 cm
S' Lateral: 2.7 cm

## 2023-01-21 ENCOUNTER — Other Ambulatory Visit (HOSPITAL_BASED_OUTPATIENT_CLINIC_OR_DEPARTMENT_OTHER): Payer: Self-pay | Admitting: Family Medicine

## 2023-01-21 DIAGNOSIS — E782 Mixed hyperlipidemia: Secondary | ICD-10-CM

## 2023-02-04 ENCOUNTER — Ambulatory Visit (HOSPITAL_BASED_OUTPATIENT_CLINIC_OR_DEPARTMENT_OTHER)
Admission: RE | Admit: 2023-02-04 | Discharge: 2023-02-04 | Disposition: A | Discharge status: Home / Self Care | Payer: Medicare Other | Source: Ambulatory Visit | Attending: Family Medicine | Admitting: Family Medicine

## 2023-02-04 DIAGNOSIS — E782 Mixed hyperlipidemia: Secondary | ICD-10-CM | POA: Insufficient documentation

## 2023-02-12 ENCOUNTER — Ambulatory Visit
Admission: RE | Admit: 2023-02-12 | Discharge: 2023-02-12 | Disposition: A | Discharge status: Home / Self Care | Payer: Medicare Other | Source: Ambulatory Visit | Attending: Family Medicine | Admitting: Family Medicine

## 2023-02-12 ENCOUNTER — Other Ambulatory Visit: Payer: Self-pay | Admitting: Family Medicine

## 2023-02-12 DIAGNOSIS — J449 Chronic obstructive pulmonary disease, unspecified: Secondary | ICD-10-CM

## 2023-03-10 ENCOUNTER — Encounter: Payer: Self-pay | Admitting: Hematology and Oncology

## 2023-06-03 ENCOUNTER — Telehealth: Payer: Self-pay | Admitting: Orthopedic Surgery

## 2023-06-03 NOTE — Telephone Encounter (Signed)
Pt called requesting to submit to insurance fro Monovisc left knee gel injection. Please call pt when approved she states her knee is starting to hurt. Last injection 10/2022

## 2023-06-05 NOTE — Telephone Encounter (Signed)
VOB submitted for Monovisc, left knee 

## 2023-06-19 NOTE — Assessment & Plan Note (Signed)
Screening mammogram: indeterminate grouped calcifications in the left breast. Diagnostic mammogram and [place]: 1.5 cm fine linear calcifications in the left breast.  Biopsy revealed high-grade DCIS ER 100%, PR 0%   Recommendation: 1. Breast conserving surgery 08/23/2021:Left lumpectomy: Focal DCIS high-grade with calcifications, margins negative, ER 100%, PR 0% 2. +/- adjuvant radiation therapy (patient does not want to do radiation) 3. Followed by antiestrogen therapy with tamoxifen 5 years (5 mg daily based upon recent clinical trials showing equivalent results at this dosage) --------------------------------------------------------------------------------------------------------------------------- Tamoxifen Toxicities: Tolerating it well without any problems or concerns.  Denies any hot flashes.  She does have chronic osteoarthritis especially in the knees.   Breast Cancer Surveillance: 1. Breast Exam: 06/23/2023: Benign 2. Mammograms: 03/06/2023: Benign   RTC in 1 year

## 2023-06-23 ENCOUNTER — Inpatient Hospital Stay: Payer: Medicare Other | Attending: Hematology and Oncology | Admitting: Hematology and Oncology

## 2023-06-23 VITALS — BP 150/79 | HR 65 | Temp 98.2°F | Resp 18 | Ht 62.0 in | Wt 168.5 lb

## 2023-06-23 DIAGNOSIS — N898 Other specified noninflammatory disorders of vagina: Secondary | ICD-10-CM | POA: Insufficient documentation

## 2023-06-23 DIAGNOSIS — M170 Bilateral primary osteoarthritis of knee: Secondary | ICD-10-CM | POA: Insufficient documentation

## 2023-06-23 DIAGNOSIS — Z79810 Long term (current) use of selective estrogen receptor modulators (SERMs): Secondary | ICD-10-CM | POA: Insufficient documentation

## 2023-06-23 DIAGNOSIS — Z923 Personal history of irradiation: Secondary | ICD-10-CM | POA: Diagnosis not present

## 2023-06-23 DIAGNOSIS — D0512 Intraductal carcinoma in situ of left breast: Secondary | ICD-10-CM

## 2023-06-23 MED ORDER — TAMOXIFEN CITRATE 10 MG PO TABS
10.0000 mg | ORAL_TABLET | Freq: Every day | ORAL | 3 refills | Status: DC
Start: 2023-06-23 — End: 2024-06-22

## 2023-06-23 NOTE — Progress Notes (Signed)
Patient Care Team: Mosetta Putt, MD as PCP - General (Family Medicine) Griselda Miner, MD as Consulting Physician (General Surgery) Serena Croissant, MD as Consulting Physician (Hematology and Oncology) Dorothy Puffer, MD as Consulting Physician (Radiation Oncology)  DIAGNOSIS:  Encounter Diagnosis  Name Primary?   Ductal carcinoma in situ (DCIS) of left breast Yes    SUMMARY OF ONCOLOGIC HISTORY: Oncology History  Ductal carcinoma in situ (DCIS) of left breast  07/19/2021 Initial Diagnosis   Screening mammogram: indeterminate grouped calcifications in the left breast. Diagnostic mammogram and [place]: 1.5 cm fine linear calcifications in the left breast.  Biopsy revealed high-grade DCIS ER 100%, PR 0%   07/25/2021 Cancer Staging   Staging form: Breast, AJCC 8th Edition - Clinical stage from 07/25/2021: Stage 0 (cTis (DCIS), cN0, cM0, G3, ER+, PR-) - Signed by Serena Croissant, MD on 07/25/2021 Stage prefix: Initial diagnosis Nuclear grade: G3 Histologic grading system: 3 grade system   07/25/2021 Genetic Testing   [facility] CancerNext-Expanded Panel was Negative. Report date is 08/07/2021.  The CancerNext-Expanded gene panel offered by [facility] and includes sequencing, rearrangement, and RNA analysis for the following 77 genes: AIP, ALK, APC, ATM, AXIN2, BAP1, BARD1, BLM, BMPR1A, BRCA1, BRCA2, BRIP1, CDC73, CDH1, CDK4, CDKN1B, CDKN2A, CHEK2, CTNNA1, DICER1, FANCC, FH, FLCN, GALNT12, KIF1B, LZTR1, MAX, MEN1, MET, MLH1, MSH2, MSH3, MSH6, MUTYH, NBN, NF1, NF2, NTHL1, PALB2, PHOX2B, PMS2, POT1, PRKAR1A, PTCH1, PTEN, RAD51C, RAD51D, RB1, RECQL, RET, SDHA, SDHAF2, SDHB, SDHC, SDHD, SMAD4, SMARCA4, SMARCB1, SMARCE1, STK11, SUFU, TMEM127, TP53, TSC1, TSC2, VHL and XRCC2 (sequencing and deletion/duplication); EGFR, EGLN1, HOXB13, KIT, MITF, PDGFRA, POLD1, and POLE (sequencing only); EPCAM and GREM1 (deletion/duplication only).    08/23/2021 Definitive Surgery   FINAL MICROSCOPIC DIAGNOSIS:   A.  BREAST, LEFT, LUMPECTOMY:  - Focal ductal carcinoma in situ, high-grade, with calcifications  - Resection margins are negative for DCIS - closest is the posterior margin at 0.1 cm  - Biopsy site changes  - Fibrocystic change with calcifications  - See oncology table    09/04/2021 -  Anti-estrogen oral therapy   Tamoxifen 5 mg     CHIEF COMPLIANT:   Discussed the use of AI scribe software for clinical note transcription with the patient, who gave verbal consent to proceed.  History of Present Illness   The patient, a two-year survivor of DCIS, has been taking Tamoxifen without significant issues. She reports occasional vaginal discharge, which is not severe or regular. She has seen a gynecologist this year and is scheduled for a follow-up in two years. She does not experience hot flashes, which she finds pleasant. The patient leads an active lifestyle, including trips to a remote location in the [facility], despite being almost 82 years old. She recently experienced a mishap with her freezer, resulting in the loss of all stored food. She also mentioned a mix-up with her appointment times today.         ALLERGIES:  is allergic to atorvastatin calcium, coffee bean extract [coffea arabica], demerol [meperidine hcl], metronidazole, other, tobacco [tobacco], hydrochlorothiazide, and triamterene.  MEDICATIONS:  Current Outpatient Medications  Medication Sig Dispense Refill   albuterol (PROVENTIL HFA;VENTOLIN HFA) 108 (90 Base) MCG/ACT inhaler Inhale 2 puffs into the lungs every 6 (six) hours as needed for wheezing or shortness of breath. Reported on 12/11/2015 (Patient not taking: Reported on 12/06/2021)     amLODipine (NORVASC) 5 MG tablet Take 5 mg by mouth daily.     BIOTIN PO Take 1 capsule by mouth daily.  Calcium Citrate-Vitamin D (CITRACAL + D PO) Take 1 tablet by mouth daily.      levothyroxine (SYNTHROID) 100 MCG tablet Take 100 mcg by mouth daily before breakfast.     losartan  (COZAAR) 100 MG tablet Take 100 mg by mouth daily.     meloxicam (MOBIC) 7.5 MG tablet Take 7.5 mg by mouth as needed for pain.  (Patient not taking: Reported on 12/06/2021)     omeprazole (PRILOSEC) 20 MG capsule Take 20 mg by mouth daily.     rosuvastatin (CRESTOR) 20 MG tablet Take 20 mg by mouth daily.     tamoxifen (NOLVADEX) 10 MG tablet Take 1 tablet (10 mg total) by mouth daily. 90 tablet 3   VITAMIN D, CHOLECALCIFEROL, PO Take 2,000 Units by mouth daily.     No current facility-administered medications for this visit.    PHYSICAL EXAMINATION: ECOG PERFORMANCE STATUS: 1 - Symptomatic but completely ambulatory  Vitals:   06/23/23 1039  BP: (!) 150/79  Pulse: 65  Resp: 18  Temp: 98.2 F (36.8 C)  SpO2: 99%   Filed Weights   06/23/23 1039  Weight: 168 lb 8 oz (76.4 kg)    Physical Exam          (exam performed in the presence of a chaperone)  LABORATORY DATA:  I have reviewed the data as listed    Latest Ref Rng & Units 07/25/2021   12:17 PM 03/27/2018    6:14 AM 02/03/2011    9:30 AM  CMP  Glucose 70 - 99 mg/dL 213  086  95   BUN 8 - 23 mg/dL 22  23  20    Creatinine 0.44 - 1.00 mg/dL 5.78  4.69  6.29   Sodium 135 - 145 mmol/L 140  139  144   Potassium 3.5 - 5.1 mmol/L 3.8  4.0  3.3   Chloride 98 - 111 mmol/L 108  109  104   CO2 22 - 32 mmol/L 23  22  26    Calcium 8.9 - 10.3 mg/dL 8.9  8.4  52.8   Total Protein 6.5 - 8.1 g/dL 6.4  6.2    Total Bilirubin 0.3 - 1.2 mg/dL 1.0  0.4    Alkaline Phos 38 - 126 U/L 55  75    AST 15 - 41 U/L 19  25    ALT 0 - 44 U/L 15  23      Lab Results  Component Value Date   WBC 4.3 07/25/2021   HGB 13.7 07/25/2021   HCT 39.4 07/25/2021   MCV 88.7 07/25/2021   PLT 166 07/25/2021   NEUTROABS 2.8 07/25/2021    ASSESSMENT & PLAN:  Ductal carcinoma in situ (DCIS) of left breast Screening mammogram: indeterminate grouped calcifications in the left breast. Diagnostic mammogram and [place]: 1.5 cm fine linear calcifications in the  left breast.  Biopsy revealed high-grade DCIS ER 100%, PR 0%   Recommendation: 1. Breast conserving surgery 08/23/2021:Left lumpectomy: Focal DCIS high-grade with calcifications, margins negative, ER 100%, PR 0% 2. +/- adjuvant radiation therapy (patient does not want to do radiation) 3. Followed by antiestrogen therapy with tamoxifen 5 years (5 mg daily based upon recent clinical trials showing equivalent results at this dosage) --------------------------------------------------------------------------------------------------------------------------- Tamoxifen Toxicities: Tolerating it well without any problems or concerns.  Denies any hot flashes.  She does have chronic osteoarthritis especially in the knees.   Breast Cancer Surveillance: 1. Breast Exam: 06/23/2023: Benign 2. Mammograms: 03/06/2023: Benign   RTC in  1 year   -      Ductal Carcinoma In Situ (DCIS) Two years post-diagnosis, tolerating Tamoxifen well with occasional vaginal discharge, a known side effect. No hot flashes reported. Mammogram in June was satisfactory. -Continue Tamoxifen as prescribed. -Continue annual follow-up visits and mammograms.  General Health Maintenance -Continue regular gynecological check-ups every two years.          No orders of the defined types were placed in this encounter.  The patient has a good understanding of the overall plan. she agrees with it. she will call with any problems that may develop before the next visit here. Total time spent: 30 mins including face to face time and time spent for planning, charting and co-ordination of care   Tamsen Meek, MD 06/23/23

## 2023-07-21 ENCOUNTER — Other Ambulatory Visit: Payer: Self-pay

## 2023-07-21 ENCOUNTER — Telehealth: Payer: Self-pay | Admitting: Family

## 2023-07-21 DIAGNOSIS — M1712 Unilateral primary osteoarthritis, left knee: Secondary | ICD-10-CM

## 2023-07-30 ENCOUNTER — Ambulatory Visit (INDEPENDENT_AMBULATORY_CARE_PROVIDER_SITE_OTHER): Payer: Medicare Other | Admitting: Family

## 2023-07-30 ENCOUNTER — Encounter: Payer: Self-pay | Admitting: Family

## 2023-07-30 DIAGNOSIS — M1712 Unilateral primary osteoarthritis, left knee: Secondary | ICD-10-CM | POA: Diagnosis not present

## 2023-07-30 MED ORDER — LIDOCAINE HCL 1 % IJ SOLN
1.0000 mL | INTRAMUSCULAR | Status: AC | PRN
Start: 2023-07-30 — End: 2023-07-30
  Administered 2023-07-30: 1 mL

## 2023-07-30 MED ORDER — HYALURONAN 88 MG/4ML IX SOSY
88.0000 mg | PREFILLED_SYRINGE | INTRA_ARTICULAR | Status: AC | PRN
Start: 2023-07-30 — End: 2023-07-30
  Administered 2023-07-30: 88 mg via INTRA_ARTICULAR

## 2023-07-30 NOTE — Progress Notes (Signed)
Office Visit Note   Patient: Kylie Taylor           Date of Birth: [birth year]           MRN: [id number] Visit Date: 07/30/2023              Requested by: Mosetta Putt, MD [address] [place],  [place] [address] PCP: Mosetta Putt, MD  Chief Complaint  Patient presents with   Left Knee - Follow-up    Monovisc buy and bill       HPI: The patient is an 82 year old woman who presents today for planned Monovisc injection left knee.  The patient states that she has had good relief with previous supplemental injections.  Has had to resume her cane due to knee pain and giving way  Assessment & Plan: Visit Diagnoses: No diagnosis found.  Plan: Monovisc injection left knee.  Patient tolerated well.  Follow-up as needed.  Follow-Up Instructions: Return if symptoms worsen or fail to improve.   Ortho Exam  Patient is alert, oriented, no adenopathy, well-dressed, normal affect, normal respiratory effort. On examination of the left lower extremity there is no erythema warmth no edema she does have crepitation with range of motion of the left knee  Imaging: No results found. No images are attached to the encounter.  Labs: No results found for: "HGBA1C", "ESRSEDRATE", "CRP", "LABURIC", "REPTSTATUS", "GRAMSTAIN", "CULT", "LABORGA"   Lab Results  Component Value Date   ALBUMIN 3.9 07/25/2021   ALBUMIN 3.7 03/27/2018    No results found for: "MG" No results found for: "VD25OH"  No results found for: "PREALBUMIN"    Latest Ref Rng & Units 07/25/2021   12:17 PM 03/27/2018    6:14 AM 02/03/2011    9:30 AM  CBC EXTENDED  WBC 4.0 - 10.5 K/uL 4.3  5.4  6.6   RBC 3.87 - 5.11 MIL/uL 4.44  4.73  5.22   Hemoglobin 12.0 - 15.0 g/dL 78.2  95.6  21.3   HCT 36.0 - 46.0 % 39.4  42.7  45.7   Platelets 150 - 400 K/uL 166  151  183   NEUT# 1.7 - 7.7 K/uL 2.8  2.1  3.8   Lymph# 0.7 - 4.0 K/uL 0.8  2.3  2.0      There is no height or weight on file to calculate BMI.  Orders:   No orders of the defined types were placed in this encounter.  No orders of the defined types were placed in this encounter.    Procedures: Large Joint Inj: L knee on 07/30/2023 4:09 PM Indications: pain Details: 18 G 1.5 in needle, anteromedial approach Medications: 1 mL lidocaine 1 %; 88 mg Hyaluronan 88 MG/4ML Consent was given by the patient.      Clinical Data: No additional findings.  ROS:  All other systems negative, except as noted in the HPI. Review of Systems  Objective: Vital Signs: There were no vitals taken for this visit.  Specialty Comments:  No specialty comments available.  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   Chest pain 03/27/2018   Bilateral primary osteoarthritis of knee 08/27/2016   Hx of adenomatous polyp of colon 01/03/2016   HYPOTHYROIDISM 02/17/2009   HYPERLIPIDEMIA 02/17/2009   ANXIETY 02/17/2009   GERD 02/17/2009  DIVERTICULOSIS OF COLON 02/17/2009   DEGENERATIVE DISC DISEASE 02/17/2009   DYSPHAGIA UNSPECIFIED 02/17/2009   Past Medical History:  Diagnosis Date   Allergy    seasonal   Angiomyolipoma    Anxiety    Asthma    Benign brain tumor ([facility])    x3   Benign positional vertigo    Bilateral bunions    and pronated feet   Carpal tunnel syndrome    Cataract    DDD (degenerative disc disease)    Diverticulosis    2007 colon   Dysphagia    Food impaction of esophagus    multiple   GERD (gastroesophageal reflux disease)    Hiatal hernia    Hx of adenomatous polyp of colon 01/03/2016   Hyperlipemia    Hypertension    Hypothyroidism    Lumbar radiculopathy    recurrent since 1996   Multiple gastric polyps    Osteopenia    Rosacea    Small vessel disease, cerebrovascular    dx in 2012   Tick bites    2017-  taking Doxycycline for this    Family History  Problem Relation Age of Onset   Alzheimer's disease Mother        died at age 76 with this and pneumonia   Anemia Mother    Hypothyroidism Mother    Hypertension Mother    Polymyalgia rheumatica Mother    Barrett's esophagus Father    Heart attack Father        died with this age 46   Migraines Father    Hypertension Father    Peptic Ulcer Disease Father    Migraines Sister    Hypothyroidism Sister    Hypertension Sister    Sjogren's syndrome Sister    Other Sister        cerebral hemorrhage and sepsis, died with these causes at 62   Migraines Brother    Other Brother        prostate disease and degenerative spine disorder   Prostate cancer Brother 73   Brain cancer Paternal Aunt    Breast cancer Paternal Aunt 71   Thyroid cancer Paternal Aunt    Brain cancer Paternal Uncle 35   Prostate cancer Paternal Uncle 43   Brain cancer Maternal Grandmother 83   Prostate cancer Paternal Grandfather    Other Son        degenerative disease of the lumbar spine   Pancreatic cancer Cousin    Colon cancer Neg Hx    Colon polyps Neg Hx    Rectal cancer Neg Hx    Stomach cancer Neg Hx    Esophageal cancer Neg Hx     Past Surgical History:  Procedure Laterality Date   BREAST LUMPECTOMY WITH RADIOACTIVE SEED LOCALIZATION Left 08/23/2021   Procedure: LEFT BREAST LUMPECTOMY WITH RADIOACTIVE SEED LOCALIZATION;  Surgeon: Griselda Miner, MD;  Location: [facility];  Service: General;  Laterality: Left;   COLONOSCOPY     DILATION AND CURETTAGE OF UTERUS     MOUTH SURGERY     PARTIAL HYSTERECTOMY     for fibroids   UPPER GASTROINTESTINAL ENDOSCOPY     Social History   Occupational History   Occupation: retired  Tobacco Use   Smoking status: Never   Smokeless tobacco: Never  Vaping Use   Vaping status: Never Used  Substance and Sexual Activity   Alcohol use: Yes    Alcohol/week: 0.0 standard drinks of alcohol  Comment: occasional   Drug use: No   Sexual activity: Not on file

## 2023-12-01 LAB — LAB REPORT - SCANNED
A1c: 5.3
EGFR: 48
TSH: 1.22 (ref 0.41–5.90)

## 2023-12-15 ENCOUNTER — Telehealth: Payer: Self-pay | Admitting: Internal Medicine

## 2023-12-15 ENCOUNTER — Encounter: Payer: Self-pay | Admitting: Internal Medicine

## 2023-12-15 NOTE — Telephone Encounter (Signed)
 The patient reports off and on painless rectal bleeding. She denies any rectal pain, itching or burning. States she is aware she has hemorrhoids and they have not been inflamed. She is having normal bowel movements that do not require straining on a regular basis. She is seeing a spot or smear of blood in her panty liner. Patient is also having some issues with swallowing and is concerned about this as well. She is most comfortable seeing her primary GI. I have scheduled her for 12/24/23.  She said to tell you she is the person who used to accompany your patient "the miracle man."

## 2023-12-15 NOTE — Telephone Encounter (Signed)
 Inbound call about rectal bleeding. PT has not seen [place] in 5 years. I did advise her to schedule an OV but unfortunately Dr. Leone Payor is booked and she was no willing to schedule with APP. She is  asking for a call back to further discuss.  Please advise.

## 2023-12-24 ENCOUNTER — Encounter: Payer: Self-pay | Admitting: Internal Medicine

## 2023-12-24 ENCOUNTER — Ambulatory Visit: Admitting: Internal Medicine

## 2023-12-24 VITALS — BP 126/72 | HR 62 | Ht 63.5 in | Wt 168.5 lb

## 2023-12-24 DIAGNOSIS — R1310 Dysphagia, unspecified: Secondary | ICD-10-CM | POA: Diagnosis not present

## 2023-12-24 DIAGNOSIS — K222 Esophageal obstruction: Secondary | ICD-10-CM | POA: Diagnosis not present

## 2023-12-24 DIAGNOSIS — K224 Dyskinesia of esophagus: Secondary | ICD-10-CM

## 2023-12-24 DIAGNOSIS — K219 Gastro-esophageal reflux disease without esophagitis: Secondary | ICD-10-CM | POA: Diagnosis not present

## 2023-12-24 DIAGNOSIS — K449 Diaphragmatic hernia without obstruction or gangrene: Secondary | ICD-10-CM | POA: Diagnosis not present

## 2023-12-24 DIAGNOSIS — K649 Unspecified hemorrhoids: Secondary | ICD-10-CM

## 2023-12-24 DIAGNOSIS — R1319 Other dysphagia: Secondary | ICD-10-CM

## 2023-12-24 MED ORDER — HYDROCORTISONE (PERIANAL) 2.5 % EX CREA: TOPICAL_CREAM | Freq: Two times a day (BID) | CUTANEOUS | 0 refills | Status: DC | PRN

## 2023-12-24 NOTE — Patient Instructions (Signed)
  You have been scheduled for an endoscopy. Please follow written instructions given to you at your visit today.  If you use inhalers (even only as needed), please bring them with you on the day of your procedure.  If you take any of the following medications, they will need to be adjusted prior to your procedure:   DO NOT TAKE 7 DAYS PRIOR TO TEST- Trulicity (dulaglutide) Ozempic, Wegovy (semaglutide) Mounjaro (tirzepatide) Bydureon Bcise (exanatide extended release)  DO NOT TAKE 1 DAY PRIOR TO YOUR TEST Rybelsus (semaglutide) Adlyxin (lixisenatide) Victoza (liraglutide) Byetta (exanatide) ___________________________________________________________________________   We have sent the following medications to your pharmacy for you to pick up at your convenience: Hydrocortisone cream  _______________________________________________________  If your blood pressure at your visit was 140/90 or greater, please contact your primary care physician to follow up on this.  _______________________________________________________  If you are age 76 or older, your body mass index should be between 23-30. Your Body mass index is 29.38 kg/m. If this is out of the aforementioned range listed, please consider follow up with your Primary Care Provider.  If you are age 6 or younger, your body mass index should be between 19-25. Your Body mass index is 29.38 kg/m. If this is out of the aformentioned range listed, please consider follow up with your Primary Care Provider.   ________________________________________________________  The [facility] providers would like to encourage you to use [facility] to communicate with providers for non-urgent requests or questions.  Due to long hold times on the telephone, sending your provider a message by [facility] may be a faster and more efficient way to get a response.  Please allow 48 business hours for a response.  Please remember that this is for non-urgent  requests.  _______________________________________________________  I appreciate the opportunity to care for you. Stan Head, MD, [facility]

## 2023-12-24 NOTE — Progress Notes (Signed)
 Kylie Taylor 83 y.o. Jan 22, 1941 [id number]  Assessment & Plan:   Encounter Diagnoses  Name Primary?   Esophageal dysphagia Yes   GERD with stricture    Hiatal hernia    Esophageal dysmotility    Bleeding hemorrhoids    Investigate dysphagia with EGD.  Plan for possible esophageal dilation.  I wonder if she does not have a persistent or recurrent stricture in the distal esophagus/GE junction.  She has some tertiary contractions on prior barium swallow so dysmotility could be an issue as well.  Reflux is not significantly symptomatic only rare heartburn on 20 mg omeprazole daily.  The risks and benefits as well as alternatives of endoscopic procedure(s) have been discussed and reviewed. All questions answered. The patient agrees to proceed.   The bleeding does sound hemorrhoidal.  We discussed this.  Patient is not inclined to pursue a colonoscopy though she would.  Will monitor this.  Consider colonoscopy if persistent issues and does not respond to treatment.  Hydrocortisone cream provided.   Meds ordered this encounter  Medications   hydrocortisone (ANUSOL-HC) 2.5 % rectal cream    Sig: Place rectally 2 (two) times daily as needed for hemorrhoids.    Dispense:  30 g    Refill:  0   CC: Mosetta Putt, MD   Subjective:   Chief Complaint: Dysphagia and rectal bleeding  HPI 83 year old woman referred by Dr. Duaine Dredge for rectal bleeding and dysphagia.  She has a history of hypertension, osteoarthritis, chronic kidney disease and breast cancer. She has experienced rectal bleeding for the past couple of months, with episodes lasting two to three days. The bleeding is bright red and moderate in amount, making the toilet water moderately red. Initially, hemorrhoids were suggested as a possible cause. The bleeding recurred shortly before her physical exam two and a half weeks ago but has since stopped. No associated pain or protrusion from the anus or rectum. Bowel movements  are generally easy, though she sometimes experiences straining.  She has not had bleeding when she has been straining.  She says she coughs a lot but has not necessarily been doing any heavy lifting.  She has a history of more diarrhea than constipation and manages her bowel habits with dietary adjustments such as eating berries and sauerkraut.  She experiences swallowing difficulties, particularly with large pills and certain foods. She has what she calls esophageal spasms and manages them by eating slowly and avoiding large meals. Large pills, such as calcium and potassium tablets, tend to come back up before going down.  Food sometimes feels like it gets stuck, but there is no significant reflux or heartburn.  Most recent episode of impact dysphagia was with a hamburger patty.  She takes omeprazole daily and has a history of a hiatal hernia.  Recent labs: March 6 Hemoglobin 13.7 MCV 98 platelets 170 white count 5.1  CMET was normal except creatinine 1.4 and total protein 5.8 with GFR 48  Wt Readings from Last 3 Encounters:  12/24/23 168 lb 8 oz (76.4 kg)  06/23/23 168 lb 8 oz (76.4 kg)  06/21/22 166 lb 6.4 oz (75.5 kg)   Previous investigations:  Barium swallow July 2019 1. Interval increase in size of small to moderate size hiatal hernia since the prior study of 2012. 2. Moderate gastroesophageal reflux. Barium pill passes into the stomach without delay. 3. Moderate tertiary contractions in the mid and distal esophagus. 4. Slight indentation upon the lower cervical esophagus from the left. Possible prominent left  thyroid gland. Correlate clinically.  EGD 2012 Dr. Juanda Chance, 16 mm Savary dilation of esophageal stricture, 4 cm hiatal hernia and fundic gland polyps also  Colonoscopy 12/28/2015 - One 2 mm polyp in the cecum, removed with a cold                            biopsy forceps. Resected and retrieved.  Adenoma                           - Moderate diverticulosis in the sigmoid  colon.                           - The examination was otherwise normal on direct                            and retroflexion views.  She reports a negative Cologuard about 2 years ago Allergies  Allergen Reactions   Atorvastatin Calcium Other (See Comments)    Abnormal liver function tests   Coffee Bean Extract [Coffea Arabica] Itching and Other (See Comments)    Indigestion, Headache   Demerol [Meperidine Hcl] Nausea And Vomiting   Metronidazole Other (See Comments)    Flagyl- mouth ulcers    Tobacco [Tobacco] Other (See Comments)    Smoke- Cannot ride in car with those who smoke d/t sensitivity   Hydrochlorothiazide Rash   Triamterene Rash   Current Meds  Medication Sig   albuterol (PROVENTIL HFA;VENTOLIN HFA) 108 (90 Base) MCG/ACT inhaler Inhale 2 puffs into the lungs as needed for wheezing or shortness of breath. Reported on 12/11/2015   Calcium Citrate-Vitamin D (CITRACAL + D PO) Take 1 tablet by mouth daily.    chlorthalidone (HYGROTON) 50 MG tablet Take 50 mg by mouth daily.       levothyroxine (SYNTHROID) 100 MCG tablet Take 100 mcg by mouth daily before breakfast.   losartan (COZAAR) 100 MG tablet Take 100 mg by mouth daily.   omeprazole (PRILOSEC) 20 MG capsule Take 20 mg by mouth daily.   Potassium Chloride ER 20 MEQ TBCR Take 1 tablet by mouth daily.   rosuvastatin (CRESTOR) 20 MG tablet Take 20 mg by mouth daily.   tamoxifen (NOLVADEX) 10 MG tablet Take 1 tablet (10 mg total) by mouth daily.   Past Medical History:  Diagnosis Date   Allergy    seasonal   Angiomyolipoma    Anxiety    Asthma    Benign brain tumor (HCC)    x3   Benign positional vertigo    Bilateral bunions    and pronated feet   Carpal tunnel syndrome    Cataract    DDD (degenerative disc disease)    Diverticulosis    2007 colon   Dysphagia    Food impaction of esophagus    multiple   GERD (gastroesophageal reflux disease)    Hiatal hernia    Hx of adenomatous polyp of colon  01/03/2016   Hyperlipemia    Hypertension    Hypothyroidism    Lumbar radiculopathy    recurrent since 1996   Migraines    Multiple gastric polyps    Osteopenia    Rosacea    Small vessel disease, cerebrovascular    dx in 2012   Tick bites    2017- taking Doxycycline for this   Past  Surgical History:  Procedure Laterality Date   BREAST LUMPECTOMY WITH RADIOACTIVE SEED LOCALIZATION Left 08/23/2021   Procedure: LEFT BREAST LUMPECTOMY WITH RADIOACTIVE SEED LOCALIZATION;  Surgeon: Griselda Miner, MD;  Location: [facility];  Service: General;  Laterality: Left;   COLONOSCOPY     DILATION AND CURETTAGE OF UTERUS     MOUTH SURGERY     PARTIAL HYSTERECTOMY     for fibroids   UPPER GASTROINTESTINAL ENDOSCOPY     Social History   Social History Narrative   She is retired.  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   She is divorced and lives alone in the same how she grew up in.  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  Active in church.   Enjoys a Sales promotion account executive in [place].   Never smoker.   1-2 drinks per month.  No substance use.   family history includes Alzheimer's disease in her mother; Anemia in her mother; Barrett's esophagus in her father; Brain cancer in her paternal aunt; Brain cancer (age of onset: 77) in her paternal uncle; Brain cancer (age of onset: 75) in her maternal grandmother; Breast cancer (age of onset: 64) in her paternal aunt; Heart attack in her father; Hypertension in her father, mother, and sister; Hypothyroidism in her mother and sister; Migraines in her brother, father, and sister; Other in her brother, sister, and son; Pancreatic cancer in her cousin; Peptic Ulcer Disease in her father; Polymyalgia rheumatica in her mother; Prostate cancer in her paternal grandfather; Prostate cancer (age of onset: 17) in her brother; Prostate cancer (age of onset: 66) in her paternal uncle; Sjogren's syndrome in her sister;  Thyroid cancer in her paternal aunt.   Review of Systems  As per HPI.  TendoVis, fatigue, cataracts.  Arthralgias back pain and occasional myalgias.  Right hand tremor.  Some anxiety.  Otherwise negative. Objective:   Physical Exam @BP  126/72   Pulse 62   Ht 5' 3.5" (1.613 m)   Wt 168 lb 8 oz (76.4 kg)   SpO2 96%   BMI 29.38 kg/m @  General:  Well-developed, well-nourished and in no acute distress Eyes:  anicteric. ENT:   Mouth and posterior pharynx free of lesions.  Neck:   supple w/o thyromegaly or mass.  Lungs: Clear to auscultation bilaterally. Heart:  S1S2, no rubs, murmurs, gallops. Abdomen:  soft, non-tender, no hepatosplenomegaly, hernia, or mass and BS+.  Rectal: ext/int hemorrhoids w/ signs of inflammation Patti [place], CMA present.  Lymph:  no cervical or supraclavicular adenopathy. Extremities:   no edema, cyanosis or clubbing Skin   no rash. Neuro:  A&O x 3.  Psych:  appropriate mood and  Affect.   Data Reviewed: See HPI.  I have reviewed Dr. Geoffery Lyons office notes and labs.

## 2024-01-04 ENCOUNTER — Encounter: Payer: Self-pay | Admitting: Certified Registered Nurse Anesthetist

## 2024-01-07 ENCOUNTER — Ambulatory Visit: Admitting: Internal Medicine

## 2024-01-07 ENCOUNTER — Encounter: Payer: Self-pay | Admitting: Internal Medicine

## 2024-01-07 VITALS — BP 148/73 | HR 68 | Temp 97.7°F | Resp 14 | Ht 63.5 in | Wt 168.0 lb

## 2024-01-07 DIAGNOSIS — K219 Gastro-esophageal reflux disease without esophagitis: Secondary | ICD-10-CM

## 2024-01-07 DIAGNOSIS — R1319 Other dysphagia: Secondary | ICD-10-CM

## 2024-01-07 DIAGNOSIS — Q399 Congenital malformation of esophagus, unspecified: Secondary | ICD-10-CM | POA: Diagnosis not present

## 2024-01-07 DIAGNOSIS — K222 Esophageal obstruction: Secondary | ICD-10-CM

## 2024-01-07 DIAGNOSIS — K317 Polyp of stomach and duodenum: Secondary | ICD-10-CM | POA: Diagnosis not present

## 2024-01-07 DIAGNOSIS — K449 Diaphragmatic hernia without obstruction or gangrene: Secondary | ICD-10-CM | POA: Diagnosis not present

## 2024-01-07 DIAGNOSIS — K224 Dyskinesia of esophagus: Secondary | ICD-10-CM

## 2024-01-07 MED ORDER — SODIUM CHLORIDE 0.9 % IV SOLN: 500.0000 mL | Freq: Once | INTRAVENOUS | Status: DC

## 2024-01-07 NOTE — Op Note (Signed)
 [facility] Patient Name: Kylie Taylor Procedure Date: 01/07/2024 2:59 PM MRN: [id number] Endoscopist: Iva Boop , MD, [id number] Age: 83 Referring MD:  Date of Birth: January 14, 1941 Gender: Female Account #: [id number] Procedure:                Upper GI endoscopy Indications:              Dysphagia Medicines:                Monitored Anesthesia Care Procedure:                Pre-Anesthesia Assessment:                           - Prior to the procedure, a History and Physical                            was performed, and patient medications and                            allergies were reviewed. The patient's tolerance of                            previous anesthesia was also reviewed. The risks                            and benefits of the procedure and the sedation                            options and risks were discussed with the patient.                            All questions were answered, and informed consent                            was obtained. Prior Anticoagulants: The patient has                            taken no anticoagulant or antiplatelet agents. ASA                            Grade Assessment: II - A patient with mild systemic                            disease. After reviewing the risks and benefits,                            the patient was deemed in satisfactory condition to                            undergo the procedure.                           After obtaining informed consent, the endoscope was  passed under direct vision. Throughout the                            procedure, the patient's blood pressure, pulse, and                            oxygen saturations were monitored continuously. The                            Olympus Scope [id number] was introduced through the                            mouth, and advanced to the second part of duodenum.                            The upper GI endoscopy was accomplished  without                            difficulty. The patient tolerated the procedure                            well. Scope In: Scope Out: Findings:                 The examined esophagus was significantly tortuous.                           One benign-appearing, intrinsic moderate                            (circumferential scarring or stenosis; an endoscope                            may pass) stenosis was found at the                            gastroesophageal junction. The stenosis was                            traversed. A TTS dilator was passed through the                            scope. Dilation with a 15-16.5-18 mm balloon and an                            18-19-20 mm balloon dilator was performed to 20 mm.                            The dilation site was examined and showed mild                            improvement in luminal narrowing. Estimated blood                            loss was minimal.  A 7 cm hiatal hernia was present.                           The gastroesophageal flap valve was visualized                            endoscopically and classified as Hill Grade IV (no                            fold, wide open lumen, hiatal hernia present).                           Multiple small semi-sessile polyps were found in                            the cardia, in the gastric fundus and in the                            gastric body.                           The exam was otherwise without abnormality.                           The cardia and gastric fundus were otherwise normal                            on retroflexion. Complications:            No immediate complications. Estimated Blood Loss:     Estimated blood loss was minimal. Impression:               - Tortuous esophagus. Consistent with esophageal                            dysmotility seen on ba swallow.                           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 Dilated. 20 mm max                           - 7 cm hiatal hernia.                           - Gastroesophageal flap valve classified as Hill                            Grade IV (no fold, wide open lumen, hiatal hernia                            present).                           -  Multiple gastric polyps. Known fundic gland                            polyps.                           - The examination was otherwise normal.                           - No specimens collected. Recommendation:           - Patient has a contact number available for                            emergencies. The signs and symptoms of potential                            delayed complications were discussed with the                            patient. Return to normal activities tomorrow.                            Written discharge instructions were provided to the                            patient.                           - Clear liquids x 1 hour then soft foods rest of                            day. Start prior diet tomorrow.                           - Continue present medications. Consider cutting                            pills. Continue to chew well and cut food small. Kenney Peacemaker, MD 01/07/2024 3:42:08 PM This report has been signed electronically.

## 2024-01-07 NOTE — Progress Notes (Signed)
1530 Report given to PACU, vss 

## 2024-01-07 NOTE — Progress Notes (Signed)
 Pt's states no medical or surgical changes since previsit or office visit.

## 2024-01-07 NOTE — Progress Notes (Signed)
 History and Physical Interval Note:  01/07/2024 3:09 PM  Kylie Taylor  has presented today for endoscopic procedure(s), with the diagnosis of  Encounter Diagnoses  Name Primary?   Esophageal dysphagia Yes   GERD with stricture    Hiatal hernia    Esophageal dysmotility   .  The various methods of evaluation and treatment have been discussed with the patient and/or family. After consideration of risks, benefits and other options for treatment, the patient has consented to  the endoscopic procedure(s).   The patient's history has been reviewed, patient examined, no change in status, stable for endoscopic procedure(s).  I have reviewed the patient's chart and labs.  Questions were answered to the patient's satisfaction.     Kenney Peacemaker, MD, [facility]

## 2024-01-07 NOTE — Progress Notes (Signed)
 Called to room to assist during endoscopic procedure.  Patient ID and intended procedure confirmed with present staff. Received instructions for my participation in the procedure from the performing physician.

## 2024-01-07 NOTE — Patient Instructions (Addendum)
 Please read handouts provided. Continue present medications. Consider cutting pills. Chew food well. Dilation Diet.    YOU HAD AN ENDOSCOPIC PROCEDURE TODAY AT THE [facility]:   Refer to the procedure report that was given to you for any specific questions about what was found during the examination.  If the procedure report does not answer your questions, please call your gastroenterologist to clarify.  If you requested that your care partner not be given the details of your procedure findings, then the procedure report has been included in a sealed envelope for you to review at your convenience later.  YOU SHOULD EXPECT: Some feelings of bloating in the abdomen. Passage of more gas than usual.  Walking can help get rid of the air that was put into your GI tract during the procedure and reduce the bloating. If you had a lower endoscopy (such as a colonoscopy or flexible sigmoidoscopy) you may notice spotting of blood in your stool or on the toilet paper. If you underwent a bowel prep for your procedure, you may not have a normal bowel movement for a few days.  Please Note:  You might notice some irritation and congestion in your nose or some drainage.  This is from the oxygen used during your procedure.  There is no need for concern and it should clear up in a day or so.  SYMPTOMS TO REPORT IMMEDIATELY:   Following upper endoscopy (EGD)  Vomiting of blood or coffee ground material  New chest pain or pain under the shoulder blades  Painful or persistently difficult swallowing  New shortness of breath  Fever of 100F or higher  Black, tarry-looking stools  For urgent or emergent issues, a gastroenterologist can be reached at any hour by calling [phone]. Do not use MyChart messaging for urgent concerns.    DIET:  We do recommend a small meal at first, but then you may proceed to your regular diet.  Drink plenty of fluids but you should avoid alcoholic beverages for 24  hours.  ACTIVITY:  You should plan to take it easy for the rest of today and you should NOT DRIVE or use heavy machinery until tomorrow (because of the sedation medicines used during the test).    FOLLOW UP: Our staff will call the number listed on your records the next business day following your procedure.  We will call around 7:15- 8:00 am to check on you and address any questions or concerns that you may have regarding the information given to you following your procedure. If we do not reach you, we will leave a message.     If any biopsies were taken you will be contacted by phone or by letter within the next 1-3 weeks.  Please call us at [phone] if you have not heard about the biopsies in 3 weeks.    SIGNATURES/CONFIDENTIALITY: You and/or your care partner have signed paperwork which will be entered into your electronic medical record.  These signatures attest to the fact that that the information above on your After Visit Summary has been reviewed and is understood.  Full responsibility of the confidentiality of this discharge information lies with you and/or your care-partner.There was a stricture where the esophagus and stomach meet. Also have abnormal motility of the esophagus suspected as per the barium swallow. Hiatal hernia and benign gastric polyps also seen.  I dilated the esophagus to help you swallow better. I hope it does. You should remain cautious and chew food  well (especially meats, bread and other similar consistency foods). Try cutting large pills if possible.  I appreciate the opportunity to care for you. Kenney Peacemaker, MD, [facility]

## 2024-01-08 ENCOUNTER — Telehealth: Payer: Self-pay | Admitting: *Deleted

## 2024-01-08 NOTE — Telephone Encounter (Signed)
  Follow up Call-     01/07/2024    1:33 PM  Call back number  Post procedure Call Back phone  # [phone]  Permission to leave phone message Yes     Patient questions:  Phone just rang.

## 2024-06-02 ENCOUNTER — Telehealth: Payer: Self-pay | Admitting: Orthopedic Surgery

## 2024-06-02 NOTE — Telephone Encounter (Signed)
 Pt called requesting a gel injection for left knee. Please call pt when approved for left knee gel injection. Also pt would like to add that her right foot is having issues and would like to be seen same time as gel injection. Pt phone number is [phone].

## 2024-06-04 ENCOUNTER — Other Ambulatory Visit: Payer: Self-pay | Admitting: Family

## 2024-06-04 DIAGNOSIS — M1712 Unilateral primary osteoarthritis, left knee: Secondary | ICD-10-CM

## 2024-06-04 NOTE — Telephone Encounter (Signed)
 Referral placed for monovisc injection.

## 2024-06-07 NOTE — Telephone Encounter (Signed)
 VOB has been submitted for gel injection

## 2024-06-15 ENCOUNTER — Other Ambulatory Visit (INDEPENDENT_AMBULATORY_CARE_PROVIDER_SITE_OTHER): Payer: Self-pay

## 2024-06-15 ENCOUNTER — Ambulatory Visit: Admitting: Orthopedic Surgery

## 2024-06-15 DIAGNOSIS — M79671 Pain in right foot: Secondary | ICD-10-CM

## 2024-06-15 DIAGNOSIS — M1712 Unilateral primary osteoarthritis, left knee: Secondary | ICD-10-CM

## 2024-06-20 ENCOUNTER — Encounter: Payer: Self-pay | Admitting: Orthopedic Surgery

## 2024-06-20 DIAGNOSIS — M1712 Unilateral primary osteoarthritis, left knee: Secondary | ICD-10-CM

## 2024-06-20 MED ORDER — HYALURONAN 88 MG/4ML IX SOSY
88.0000 mg | PREFILLED_SYRINGE | INTRA_ARTICULAR | Status: AC | PRN
  Administered 2024-06-20: 88 mg via INTRA_ARTICULAR

## 2024-06-20 MED ORDER — LIDOCAINE HCL (PF) 1 % IJ SOLN
2.0000 mL | INTRAMUSCULAR | Status: AC | PRN
  Administered 2024-06-20: 2 mL

## 2024-06-20 NOTE — Progress Notes (Signed)
 Office Visit Note   Patient: Kylie Taylor           Date of Birth: 10/22/1940           MRN: [id number] Visit Date: 06/15/2024              Requested by: Windy Coy, MD [address] [place],  [place] [address] PCP: Windy Coy, MD  Chief Complaint  Patient presents with   Right Foot - Pain   Left Knee - Follow-up    Monovisc injection      HPI: Discussed the use of AI scribe software for clinical note transcription with the patient, who gave verbal consent to proceed.  History of Present Illness Kylie Taylor is an 83 year old female who presents with foot pain due to a bunion deformity.  She experiences pain in both feet, with the right foot being more painful. The pain is sudden in onset and radiates from the midfoot to the toes.  She has been unable to walk barefoot in her house for several years due to foot pain. Recently, she started wearing barefoot shoes, which have allowed her to walk barefooted again. These shoes make her more conscious of how she handles her feet, as they require her to use her foot muscles more.  There is a family history of bunions, as her father also had them. Additionally, there is a family history of essential tremor from her mother.     Assessment & Plan: Visit Diagnoses:  1. Pain in right foot   2. Primary osteoarthritis of left knee     Plan: Assessment and Plan Assessment & Plan Right foot pain due to bunion deformity and midfoot osteoarthritis Chronic pain due to bunion deformity and midfoot osteoarthritis, exacerbated by long second and third metatarsals and inadequate shoe support. Radiographs confirm bunion deformity and midfoot arthritis. - Continue stiff-soled shoes for long distances to reduce metatarsal stress. - Consider barefoot shoes to alter gait and engage foot muscles differently.      Follow-Up Instructions: Return if symptoms worsen or fail to improve.   Ortho Exam  Patient is alert, oriented,  no adenopathy, well-dressed, normal affect, normal respiratory effort. Physical Exam EXTREMITIES: Tenderness beneath second and third metatarsal heads. Good capillary refill in foot. Dorsiflexion of ankle just past neutral. No Achilles contracture.  Patient is overloading the 2nd and 3rd metatarsal heads secondary to the hallux valgus deformity and shortening of the medial column      Imaging: No results found. No images are attached to the encounter.  Labs: No results found for: HGBA1C, ESRSEDRATE, CRP, LABURIC, REPTSTATUS, GRAMSTAIN, CULT, LABORGA   Lab Results  Component Value Date   ALBUMIN 3.9 07/25/2021   ALBUMIN 3.7 03/27/2018    No results found for: MG No results found for: VD25OH  No results found for: PREALBUMIN    Latest Ref Rng & Units 07/25/2021   12:17 PM 03/27/2018    6:14 AM 02/03/2011    9:30 AM  CBC EXTENDED  WBC 4.0 - 10.5 K/uL 4.3  5.4  6.6   RBC 3.87 - 5.11 MIL/uL 4.44  4.73  5.22   Hemoglobin 12.0 - 15.0 g/dL 86.2  85.2  84.1   HCT 36.0 - 46.0 % 39.4  42.7  45.7   Platelets 150 - 400 K/uL 166  151  183   NEUT# 1.7 - 7.7 K/uL 2.8  2.1  3.8   Lymph# 0.7 - 4.0 K/uL 0.8  2.3  2.0  There is no height or weight on file to calculate BMI.  Orders:  Orders Placed This Encounter  Procedures   XR Foot Complete Right   No orders of the defined types were placed in this encounter.    Procedures: Large Joint Inj: L knee on 06/20/2024 8:54 AM Indications: pain and diagnostic evaluation Details: 22 G 1.5 in needle, anteromedial approach  Arthrogram: No  Medications: 2 mL lidocaine  (PF) 1 %; 88 mg Hyaluronan 88 MG/4ML Outcome: tolerated well, no immediate complications Procedure, treatment alternatives, risks and benefits explained, specific risks discussed. Consent was given by the patient. Immediately prior to procedure a time out was called to verify the correct patient, procedure, equipment, support staff and site/side marked  as required. Patient was prepped and draped in the usual sterile fashion.      Clinical Data: No additional findings.  ROS:  All other systems negative, except as noted in the HPI. Review of Systems  Objective: Vital Signs: There were no vitals taken for this visit.  Specialty Comments:  No specialty comments available.  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   Chest pain 03/27/2018   Bilateral primary osteoarthritis of knee 08/27/2016   Hx of adenomatous polyp of colon 01/03/2016   HYPOTHYROIDISM 02/17/2009   HYPERLIPIDEMIA 02/17/2009   ANXIETY 02/17/2009   GERD 02/17/2009   DIVERTICULOSIS OF COLON 02/17/2009   DEGENERATIVE DISC DISEASE 02/17/2009   DYSPHAGIA UNSPECIFIED 02/17/2009   Past Medical History:  Diagnosis Date   Allergy    seasonal   Angiomyolipoma    Anxiety    Asthma    Benign brain tumor (HCC)    x3   Benign positional vertigo    Bilateral bunions    and pronated feet   Carpal tunnel syndrome    Cataract    DDD (degenerative disc disease)    Diverticulosis    2007 colon   Dysphagia    Food impaction of esophagus    multiple   GERD (gastroesophageal reflux disease)    Hiatal hernia    Hx of adenomatous polyp of colon 01/03/2016   Hyperlipemia    Hypertension    Hypothyroidism    Lumbar radiculopathy    recurrent since 1996   Migraines    Multiple gastric polyps    Osteopenia    Rosacea    Small vessel disease, cerebrovascular    dx in 2012   Tick bites    2017- taking Doxycycline for this    Family History  Problem Relation Age of Onset   Alzheimer's disease Mother        died at age 21 with this and pneumonia   Anemia Mother    Hypothyroidism Mother    Hypertension Mother    Polymyalgia rheumatica Mother    Barrett's  esophagus Father    Heart attack Father        died with this age 97   Migraines Father    Hypertension Father    Peptic Ulcer Disease Father    Migraines Sister    Hypothyroidism Sister    Hypertension Sister    Sjogren's syndrome Sister    Other Sister        cerebral hemorrhage and sepsis, died with these causes at 6   Migraines Brother  Other Brother        prostate disease and degenerative spine disorder   Prostate cancer Brother 35   Brain cancer Paternal Aunt    Breast cancer Paternal Aunt 4   Thyroid cancer Paternal Aunt    Brain cancer Paternal Uncle 32   Prostate cancer Paternal Uncle 66   Brain cancer Maternal Grandmother 64   Prostate cancer Paternal Grandfather    Other Son        degenerative disease of the lumbar spine   Pancreatic cancer Cousin    Colon cancer Neg Hx    Colon polyps Neg Hx    Rectal cancer Neg Hx    Stomach cancer Neg Hx    Esophageal cancer Neg Hx     Past Surgical History:  Procedure Laterality Date   BREAST LUMPECTOMY WITH RADIOACTIVE SEED LOCALIZATION Left 08/23/2021   Procedure: LEFT BREAST LUMPECTOMY WITH RADIOACTIVE SEED LOCALIZATION;  Surgeon: Curvin Deward MOULD, MD;  Location: [facility];  Service: General;  Laterality: Left;   COLONOSCOPY     DILATION AND CURETTAGE OF UTERUS     MOUTH SURGERY     PARTIAL HYSTERECTOMY     for fibroids   UPPER GASTROINTESTINAL ENDOSCOPY     Social History   Occupational History   Occupation: retired  Tobacco Use   Smoking status: Never   Smokeless tobacco: Never  Vaping Use   Vaping status: Never Used  Substance and Sexual Activity   Alcohol use: Yes    Alcohol/week: 0.0 standard drinks of alcohol    Comment: occasional   Drug use: No   Sexual activity: Not on file

## 2024-06-22 ENCOUNTER — Inpatient Hospital Stay: Payer: Medicare Other | Attending: Hematology and Oncology | Admitting: Hematology and Oncology

## 2024-06-22 VITALS — BP 130/80 | HR 58 | Temp 97.6°F | Resp 20 | Ht 65.0 in | Wt 168.0 lb

## 2024-06-22 DIAGNOSIS — Z79810 Long term (current) use of selective estrogen receptor modulators (SERMs): Secondary | ICD-10-CM | POA: Diagnosis not present

## 2024-06-22 DIAGNOSIS — Z170 Estrogen receptor positive status [ER+]: Secondary | ICD-10-CM | POA: Insufficient documentation

## 2024-06-22 DIAGNOSIS — Z1722 Progesterone receptor negative status: Secondary | ICD-10-CM | POA: Diagnosis not present

## 2024-06-22 DIAGNOSIS — D0512 Intraductal carcinoma in situ of left breast: Secondary | ICD-10-CM | POA: Diagnosis present

## 2024-06-22 NOTE — Assessment & Plan Note (Signed)
 Screening mammogram: indeterminate grouped calcifications in the left breast. Diagnostic mammogram and US : 1.5 cm fine linear calcifications in the left breast.  Biopsy revealed high-grade DCIS ER 100%, PR 0%   Recommendation: 1. Breast conserving surgery 08/23/2021:Left lumpectomy: Focal DCIS high-grade with calcifications, margins negative, ER 100%, PR 0% 2. +/- adjuvant radiation therapy (patient does not want to do radiation) 3. Followed by antiestrogen therapy with tamoxifen  5 years (5 mg daily based upon recent clinical trials showing equivalent results at this dosage) --------------------------------------------------------------------------------------------------------------------------- Tamoxifen  Toxicities: Tolerating it well without any problems or concerns.  Denies any hot flashes.  She does have chronic osteoarthritis especially in the knees.   Breast Cancer Surveillance: 1. Breast Exam: 06/22/2024: Benign 2. Mammograms: 03/06/2023: Benign   RTC in 1 year

## 2024-06-22 NOTE — Progress Notes (Signed)
 Patient Care Team: Windy Coy, MD as PCP - General (Family Medicine) Curvin Deward MOULD, MD as Consulting Physician (General Surgery) Odean Potts, MD as Consulting Physician (Hematology and Oncology) Dewey Rush, MD as Consulting Physician (Radiation Oncology)  DIAGNOSIS:  Encounter Diagnosis  Name Primary?   Ductal carcinoma in situ (DCIS) of left breast Yes    SUMMARY OF ONCOLOGIC HISTORY: Oncology History  Ductal carcinoma in situ (DCIS) of left breast  07/19/2021 Initial Diagnosis   Screening mammogram: indeterminate grouped calcifications in the left breast. Diagnostic mammogram and US : 1.5 cm fine linear calcifications in the left breast.  Biopsy revealed high-grade DCIS ER 100%, PR 0%   07/25/2021 Cancer Staging   Staging form: Breast, AJCC 8th Edition - Clinical stage from 07/25/2021: Stage 0 (cTis (DCIS), cN0, cM0, G3, ER+, PR-) - Signed by Odean Potts, MD on 07/25/2021 Stage prefix: Initial diagnosis Nuclear grade: G3 Histologic grading system: 3 grade system   07/25/2021 Genetic Testing   [facility] CancerNext-Expanded Panel was Negative. Report date is 08/07/2021.  The CancerNext-Expanded gene panel offered by [facility] and includes sequencing, rearrangement, and RNA analysis for the following 77 genes: AIP, ALK, APC, ATM, AXIN2, BAP1, BARD1, BLM, BMPR1A, BRCA1, BRCA2, BRIP1, CDC73, CDH1, CDK4, CDKN1B, CDKN2A, CHEK2, CTNNA1, DICER1, FANCC, FH, FLCN, GALNT12, KIF1B, LZTR1, MAX, MEN1, MET, MLH1, MSH2, MSH3, MSH6, MUTYH, NBN, NF1, NF2, NTHL1, PALB2, PHOX2B, PMS2, POT1, PRKAR1A, PTCH1, PTEN, RAD51C, RAD51D, RB1, RECQL, RET, SDHA, SDHAF2, SDHB, SDHC, SDHD, SMAD4, SMARCA4, SMARCB1, SMARCE1, STK11, SUFU, TMEM127, TP53, TSC1, TSC2, VHL and XRCC2 (sequencing and deletion/duplication); EGFR, EGLN1, HOXB13, KIT, MITF, PDGFRA, POLD1, and POLE (sequencing only); EPCAM and GREM1 (deletion/duplication only).    08/23/2021 Definitive Surgery   FINAL MICROSCOPIC DIAGNOSIS:   A.  BREAST, LEFT, LUMPECTOMY:  - Focal ductal carcinoma in situ, high-grade, with calcifications  - Resection margins are negative for DCIS - closest is the posterior margin at 0.1 cm  - Biopsy site changes  - Fibrocystic change with calcifications  - See oncology table    09/04/2021 -  Anti-estrogen oral therapy   Tamoxifen  5 mg     CHIEF COMPLIANT:   HISTORY OF PRESENT ILLNESS:  History of Present Illness Kylie Taylor is an 83 year old female with ductal carcinoma in situ (DCIS) who presents for follow-up regarding tamoxifen  therapy.  She was diagnosed with DCIS in 2022 and has been on low-dose tamoxifen  for three years. She experiences occasional discharge, which is infrequent and significantly less than before. She reports no significant side effects from tamoxifen .  Her mammograms and bone density tests were conducted at [facility], ordered by her primary care provider, and results were not automatically sent to her oncologist.  She wants to reduce her medication burden and is considering stopping tamoxifen  due to taking multiple medications. She prefers to minimize medications and requires justification for each during her annual physical.     ALLERGIES:  is allergic to demerol [meperidine hcl], atorvastatin calcium, coffee bean extract [coffea arabica], hydrochlorothiazide , metronidazole, tobacco [tobacco], and triamterene .  MEDICATIONS:  Current Outpatient Medications  Medication Sig Dispense Refill   albuterol  (PROVENTIL  HFA;VENTOLIN  HFA) 108 (90 Base) MCG/ACT inhaler Inhale 2 puffs into the lungs as needed for wheezing or shortness of breath. Reported on 12/11/2015     Calcium Citrate-Vitamin D (CITRACAL + D PO) Take 1 tablet by mouth daily.      chlorthalidone (HYGROTON) 50 MG tablet Take 50 mg by mouth daily.     levothyroxine  (SYNTHROID ) 100 MCG tablet Take  100 mcg by mouth daily before breakfast.     losartan  (COZAAR ) 100 MG tablet Take 100 mg by mouth daily.     meloxicam  (MOBIC) 7.5 MG tablet Take 7.5 mg by mouth as needed for pain.      omeprazole (PRILOSEC) 20 MG capsule Take 20 mg by mouth daily.     potassium chloride (KLOR-CON M) 10 MEQ tablet Take 10 mEq by mouth 2 (two) times daily.     propranolol ER (INDERAL LA) 60 MG 24 hr capsule Take by mouth 2 (two) times daily.     rosuvastatin (CRESTOR) 20 MG tablet Take 20 mg by mouth daily.     tamoxifen  (NOLVADEX ) 10 MG tablet Take 1 tablet (10 mg total) by mouth daily. 90 tablet 3   No current facility-administered medications for this visit.    PHYSICAL EXAMINATION: ECOG PERFORMANCE STATUS: 1 - Symptomatic but completely ambulatory  Vitals:   06/22/24 1059  BP: 130/80  Pulse: (!) 58  Resp: 20  Temp: 97.6 F (36.4 C)  SpO2: 100%   Filed Weights   06/22/24 1059  Weight: 168 lb (76.2 kg)    Physical Exam   (exam performed in the presence of a chaperone)  LABORATORY DATA:  I have reviewed the data as listed    Latest Ref Rng & Units 07/25/2021   12:17 PM 03/27/2018    6:14 AM 02/03/2011    9:30 AM  CMP  Glucose 70 - 99 mg/dL 894  898  95   BUN 8 - 23 mg/dL 22  23  20    Creatinine 0.44 - 1.00 mg/dL 8.80  8.90  9.19   Sodium 135 - 145 mmol/L 140  139  144   Potassium 3.5 - 5.1 mmol/L 3.8  4.0  3.3   Chloride 98 - 111 mmol/L 108  109  104   CO2 22 - 32 mmol/L 23  22  26    Calcium 8.9 - 10.3 mg/dL 8.9  8.4  89.7   Total Protein 6.5 - 8.1 g/dL 6.4  6.2    Total Bilirubin 0.3 - 1.2 mg/dL 1.0  0.4    Alkaline Phos 38 - 126 U/L 55  75    AST 15 - 41 U/L 19  25    ALT 0 - 44 U/L 15  23      Lab Results  Component Value Date   WBC 4.3 07/25/2021   HGB 13.7 07/25/2021   HCT 39.4 07/25/2021   MCV 88.7 07/25/2021   PLT 166 07/25/2021   NEUTROABS 2.8 07/25/2021    ASSESSMENT & PLAN:  Ductal carcinoma in situ (DCIS) of left breast Screening mammogram: indeterminate grouped calcifications in the left breast. Diagnostic mammogram and US : 1.5 cm fine linear calcifications in the left  breast.  Biopsy revealed high-grade DCIS ER 100%, PR 0%   Recommendation: 1. Breast conserving surgery 08/23/2021:Left lumpectomy: Focal DCIS high-grade with calcifications, margins negative, ER 100%, PR 0% 2. +/- adjuvant radiation therapy (patient does not want to do radiation) 3. Followed by antiestrogen therapy with tamoxifen  5 years (5 mg daily based upon recent clinical trials showing equivalent results at this dosage) started 09/04/2021-06/22/2024 --------------------------------------------------------------------------------------------------------------------------- Tamoxifen  Toxicities: Tolerating it well without any problems or concerns.  Denies any hot flashes.  She does have chronic osteoarthritis especially in the knees. After much discussion about risks and benefits of tamoxifen  we decided that 3 years was enough for antiestrogen therapy and therefore she will discontinue tamoxifen  at this time.  Breast Cancer Surveillance: 1. Breast Exam: 06/22/2024: Benign 2. Mammograms: 03/06/2023: Benign   RTC on an as-needed basis  Assessment & Plan Ductal carcinoma in situ (DCIS) of left breast Three years post-treatment for DCIS of the left breast. On low-dose tamoxifen  for three years with minimal side effects. Risk of recurrence reduced from 6% to 3% with tamoxifen , potentially to 2% with continued use. Decision made to discontinue tamoxifen  due to age and preference to reduce medication burden. Discussed risks of tamoxifen , including stroke, heart attack, and blood clots. Benefits of tamoxifen  to persist for at least six years post-discontinuation. - Discontinue tamoxifen . - Continue regular mammograms. - No routine follow-up unless mammograms indicate otherwise.      No orders of the defined types were placed in this encounter.  The patient has a good understanding of the overall plan. she agrees with it. she will call with any problems that may develop before the next visit  here. Total time spent: 30 mins including face to face time and time spent for planning, charting and co-ordination of care   Viinay K Mika Griffitts, MD 06/22/24

## 2024-06-24 ENCOUNTER — Encounter: Payer: Self-pay | Admitting: Family Medicine

## 2024-07-10 ENCOUNTER — Other Ambulatory Visit: Payer: Self-pay | Admitting: Hematology and Oncology

## 2024-07-10 DIAGNOSIS — D0512 Intraductal carcinoma in situ of left breast: Secondary | ICD-10-CM

## 2024-07-26 ENCOUNTER — Encounter: Payer: Self-pay | Admitting: Radiology
